# Patient Record
Sex: Female | Born: 1970 | Race: White | Smoking: Current every day smoker
Health system: Southern US, Community
[De-identification: ages and names within clinical notes are randomized; demographics above are authoritative.]

## PROBLEM LIST (undated history)

## (undated) DIAGNOSIS — M069 Rheumatoid arthritis, unspecified: Secondary | ICD-10-CM

## (undated) DIAGNOSIS — M459 Ankylosing spondylitis of unspecified sites in spine: Secondary | ICD-10-CM

## (undated) DIAGNOSIS — I499 Cardiac arrhythmia, unspecified: Secondary | ICD-10-CM

## (undated) DIAGNOSIS — G43909 Migraine, unspecified, not intractable, without status migrainosus: Secondary | ICD-10-CM

## (undated) DIAGNOSIS — J302 Other seasonal allergic rhinitis: Secondary | ICD-10-CM

## (undated) DIAGNOSIS — M351 Other overlap syndromes: Secondary | ICD-10-CM

## (undated) DIAGNOSIS — D649 Anemia, unspecified: Secondary | ICD-10-CM

## (undated) DIAGNOSIS — F419 Anxiety disorder, unspecified: Secondary | ICD-10-CM

## (undated) DIAGNOSIS — R630 Anorexia: Secondary | ICD-10-CM

## (undated) HISTORY — DX: Rheumatoid arthritis, unspecified: M06.9

## (undated) HISTORY — DX: Anemia, unspecified: D64.9

## (undated) HISTORY — DX: Migraine, unspecified, not intractable, without status migrainosus: G43.909

## (undated) HISTORY — DX: Cardiac arrhythmia, unspecified: I49.9

## (undated) HISTORY — DX: Ankylosing spondylitis of unspecified sites in spine: M45.9

## (undated) HISTORY — DX: Other overlap syndromes: M35.1

## (undated) HISTORY — DX: Other seasonal allergic rhinitis: J30.2

## (undated) HISTORY — DX: Anorexia: R63.0

## (undated) HISTORY — DX: Anxiety disorder, unspecified: F41.9

---
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
## 1995-12-17 HISTORY — PX: ABDOMINAL HYSTERECTOMY: SHX81

## 1996-12-16 HISTORY — PX: BREAST ENHANCEMENT SURGERY: SHX7

## 1999-12-17 DIAGNOSIS — F419 Anxiety disorder, unspecified: Secondary | ICD-10-CM

## 1999-12-17 HISTORY — DX: Anxiety disorder, unspecified: F41.9

## 2001-12-16 DIAGNOSIS — I499 Cardiac arrhythmia, unspecified: Secondary | ICD-10-CM

## 2001-12-16 HISTORY — PX: BACK SURGERY: SHX140

## 2001-12-16 HISTORY — DX: Cardiac arrhythmia, unspecified: I49.9

## 2002-12-16 HISTORY — PX: BACK SURGERY: SHX140

## 2004-12-16 DIAGNOSIS — J302 Other seasonal allergic rhinitis: Secondary | ICD-10-CM

## 2004-12-16 HISTORY — DX: Other seasonal allergic rhinitis: J30.2

## 2004-12-16 HISTORY — PX: INFUSION PUMP IMPLANTATION: SHX1824

## 2015-12-17 DIAGNOSIS — M069 Rheumatoid arthritis, unspecified: Secondary | ICD-10-CM

## 2015-12-17 DIAGNOSIS — M351 Other overlap syndromes: Secondary | ICD-10-CM

## 2015-12-17 HISTORY — DX: Other overlap syndromes: M35.1

## 2015-12-17 HISTORY — DX: Rheumatoid arthritis, unspecified: M06.9

## 2016-12-18 DIAGNOSIS — Z5181 Encounter for therapeutic drug level monitoring: Secondary | ICD-10-CM | POA: Diagnosis not present

## 2016-12-18 DIAGNOSIS — M797 Fibromyalgia: Secondary | ICD-10-CM | POA: Diagnosis not present

## 2016-12-18 DIAGNOSIS — M351 Other overlap syndromes: Secondary | ICD-10-CM | POA: Diagnosis not present

## 2016-12-18 DIAGNOSIS — M545 Low back pain: Secondary | ICD-10-CM | POA: Diagnosis not present

## 2016-12-18 DIAGNOSIS — M961 Postlaminectomy syndrome, not elsewhere classified: Secondary | ICD-10-CM | POA: Diagnosis not present

## 2016-12-18 DIAGNOSIS — M5116 Intervertebral disc disorders with radiculopathy, lumbar region: Secondary | ICD-10-CM | POA: Diagnosis not present

## 2016-12-18 DIAGNOSIS — M5136 Other intervertebral disc degeneration, lumbar region: Secondary | ICD-10-CM | POA: Diagnosis not present

## 2016-12-18 DIAGNOSIS — G894 Chronic pain syndrome: Secondary | ICD-10-CM | POA: Diagnosis not present

## 2016-12-18 DIAGNOSIS — M5416 Radiculopathy, lumbar region: Secondary | ICD-10-CM | POA: Diagnosis not present

## 2016-12-18 DIAGNOSIS — G43909 Migraine, unspecified, not intractable, without status migrainosus: Secondary | ICD-10-CM | POA: Diagnosis not present

## 2017-02-27 DIAGNOSIS — M25512 Pain in left shoulder: Secondary | ICD-10-CM | POA: Diagnosis not present

## 2017-02-27 DIAGNOSIS — M25532 Pain in left wrist: Secondary | ICD-10-CM | POA: Diagnosis not present

## 2017-02-27 DIAGNOSIS — M25561 Pain in right knee: Secondary | ICD-10-CM | POA: Diagnosis not present

## 2017-02-27 DIAGNOSIS — M25531 Pain in right wrist: Secondary | ICD-10-CM | POA: Diagnosis not present

## 2017-02-27 DIAGNOSIS — M25562 Pain in left knee: Secondary | ICD-10-CM | POA: Diagnosis not present

## 2017-02-27 DIAGNOSIS — M25511 Pain in right shoulder: Secondary | ICD-10-CM | POA: Diagnosis not present

## 2017-02-28 DIAGNOSIS — H04123 Dry eye syndrome of bilateral lacrimal glands: Secondary | ICD-10-CM | POA: Diagnosis not present

## 2017-02-28 DIAGNOSIS — H1840 Unspecified corneal degeneration: Secondary | ICD-10-CM | POA: Diagnosis not present

## 2017-02-28 DIAGNOSIS — H43813 Vitreous degeneration, bilateral: Secondary | ICD-10-CM | POA: Diagnosis not present

## 2017-02-28 DIAGNOSIS — H2513 Age-related nuclear cataract, bilateral: Secondary | ICD-10-CM | POA: Diagnosis not present

## 2017-02-28 DIAGNOSIS — H524 Presbyopia: Secondary | ICD-10-CM | POA: Diagnosis not present

## 2017-03-10 DIAGNOSIS — D899 Disorder involving the immune mechanism, unspecified: Secondary | ICD-10-CM | POA: Diagnosis not present

## 2017-03-10 DIAGNOSIS — M2550 Pain in unspecified joint: Secondary | ICD-10-CM | POA: Diagnosis not present

## 2017-04-09 DIAGNOSIS — M23205 Derangement of unspecified medial meniscus due to old tear or injury, unspecified knee: Secondary | ICD-10-CM | POA: Diagnosis not present

## 2017-11-27 DIAGNOSIS — J40 Bronchitis, not specified as acute or chronic: Secondary | ICD-10-CM | POA: Diagnosis not present

## 2018-05-09 NOTE — Progress Notes (Signed)
Subjective:    Patient ID: Alexandria Osborn, female    DOB: [birth year], 47 y.o.   MRN: [id number]   CC: New Patient  HPI: PMHx: Past Medical History:  Diagnosis Date  . Anemia 1989  . Anorexia 1989  . Anxiety 2001  . Irregular heart beat 2003  . Migraine 1979  . Mixed connective tissue disease (HCC) 2017  . Rheumatoid arthritis (HCC) 2017  . Seasonal allergies 2006     Surgical Hx: Past Surgical History:  Procedure Laterality Date  . BREAST ENHANCEMENT SURGERY Bilateral 1998  . HYSTEROTOMY Bilateral 1997   total  . INFUSION PUMP IMPLANTATION  2006     Family Hx: Family History  Problem Relation Age of Onset  . Heart disease Mother   . Hyperlipidemia Mother   . Hypertension Mother   . Osteoporosis Mother   . Liver cancer Father   . Anxiety disorder Father   . Diabetes Father   . Hypertension Father   . Stroke Father   . Alcohol abuse Father   . Alcohol abuse Brother   . Drug abuse Brother   . Rheum arthritis Brother   . Anxiety disorder Brother   . Rheum arthritis Maternal Grandmother   . Lung cancer Maternal Grandmother   . Cervical cancer Maternal Grandmother   . Hyperlipidemia Maternal Grandmother   . Hypertension Maternal Grandmother   . Osteoporosis Maternal Grandmother   . Stroke Maternal Grandmother      Social Hx: Who lives at home: mother (82 y.o.), dogs, cats 05/18/2018  Who would speak for you about health care matters: Raford Pitcher 05/18/2018  Transportation: other 05/18/2018 Interests / Fun: spend time with grandkids, read, cook (when able), walks 05/18/2018 Other: current smoker, 1/2 ppd since 1994 05/18/2018   Medications: Fentanyl patch q72hrs Oxycodone tid prn topamax 200mg   zanaflex 4 mg tid prn neurontin 800mg  tid prn Methotrexate 2.5 mg once weekly  humira 1 injection pen bi monthly  Folic acid 1 mg dail  ROS: Patient reports skin lesion on left temporal side of face, states it has been there for years.  Denies vision/ hearing  changes,anorexia, weight change, fever ,adenopathy, persistant / recurrent hoarseness, swallowing issues, chest pain, edema,persistant / recurrent cough, hemoptysis, dyspnea(rest, exertional, paroxysmal nocturnal), gastrointestinal  bleeding (melena, rectal bleeding), abdominal pain, excessive heart burn, GU symptoms(dysuria, hematuria, pyuria, voiding/incontinence  Issues) syncope, focal weakness, severe memory loss, depression, anxiety, abnormal bruising/bleeding, major joint swelling, breast masses or abnormal vaginal bleeding.     Preventative Screening Colonoscopy: year 2016  Mammogram: year 2016  DEXA: year 2016   Heart stress test: year 2018  Xrays: year 2018  CT/MRI: year 2018    Objective:  BP 110/70   Pulse 91   Temp 98.5 F (36.9 C) (Oral)   Ht 5' 4.85" (1.647 m)   Wt 110 lb (49.9 kg)   SpO2 98%   BMI 18.39 kg/m  Vitals and nursing note reviewed  General: well nourished, in no acute distress HEENT: normocephalic, TM's visualized bilaterally, no scleral icterus or conjunctival pallor, no nasal discharge, moist mucous membranes, good dentition without erythema or discharge noted in posterior oropharynx Neck: supple, non-tender, without lymphadenopathy Cardiac: RRR, clear S1 and S2, no murmurs, rubs, or gallops Respiratory: clear to auscultation bilaterally, no increased work of breathing Abdomen: soft, nontender, nondistended, no masses or organomegaly. Bowel sounds present Extremities: no edema or cyanosis. Warm, well perfused. 2+ radial and PT pulses bilaterally Skin: warm and dry, no rashes noted, area of hyperpigmentation  on left temporal, flat, no scaling Neuro: alert and oriented, no focal deficits   Assessment & Plan:    Healthcare maintenance Patient given/signed release of records information. Patient has extensive PMHx and is seeing multiple specialists when living in [place]. Have asked for patient to release records from PCP and from  specialists. Patient agreeable and has signed papers.   Rheumatoid arthritis (HCC) Have referred patient to rheumatology. Patient had previously been seeing rheum in [place] but was unable to establish care since moving to [place] without a PCP referral.   Mixed connective tissue disease (HCC) Have referred to both rheum and ophthalmology. Patient had previously been seeing rheum and ophthalmology in [place] but was unable to establish care since moving to [place] without a PCP referral.   Chronic pain syndrome Have referred patient to pain management. Patient had previously been seeing pain management in [place], [place] but was unable to establish care since moving to [place] without a PCP referral.   Skin change Unclear etiology at this time. Given length of time without any changes to size/pigmentation am less concerned for malignancy. Have informed patient to follow up in dermatology clinic.     Return for Derm clinic.   Oralia Manis, DO, PGY-1

## 2018-05-15 ENCOUNTER — Other Ambulatory Visit: Payer: Self-pay

## 2018-05-15 ENCOUNTER — Encounter: Payer: Self-pay | Admitting: Family Medicine

## 2018-05-15 ENCOUNTER — Ambulatory Visit: Payer: Medicaid Other | Admitting: Family Medicine

## 2018-05-15 VITALS — BP 110/70 | HR 91 | Temp 98.5°F | Ht 64.85 in | Wt 110.0 lb

## 2018-05-15 DIAGNOSIS — Z0100 Encounter for examination of eyes and vision without abnormal findings: Secondary | ICD-10-CM

## 2018-05-15 DIAGNOSIS — R239 Unspecified skin changes: Secondary | ICD-10-CM | POA: Diagnosis not present

## 2018-05-15 DIAGNOSIS — G894 Chronic pain syndrome: Secondary | ICD-10-CM | POA: Diagnosis not present

## 2018-05-15 DIAGNOSIS — M351 Other overlap syndromes: Secondary | ICD-10-CM

## 2018-05-15 DIAGNOSIS — J302 Other seasonal allergic rhinitis: Secondary | ICD-10-CM

## 2018-05-15 DIAGNOSIS — Z0000 Encounter for general adult medical examination without abnormal findings: Secondary | ICD-10-CM

## 2018-05-15 DIAGNOSIS — M069 Rheumatoid arthritis, unspecified: Secondary | ICD-10-CM | POA: Diagnosis not present

## 2018-05-15 NOTE — Patient Instructions (Signed)
It was a pleasure seeing you today.   Today we discussed your new patient visit  For your chronic problems that you see specialists for: I have referred you to pain management, rheumatology, and ophthalmology.   Please follow up with dermatology clinic as needed or sooner if symptoms persist or worsen. Please call the clinic immediately if you have any concerns.   Our clinic's number is [phone]. Please call with questions or concerns.   Thank you,  Oralia Manis, DO

## 2018-05-17 ENCOUNTER — Encounter: Payer: Self-pay | Admitting: Family Medicine

## 2018-05-17 DIAGNOSIS — G894 Chronic pain syndrome: Secondary | ICD-10-CM | POA: Insufficient documentation

## 2018-05-17 DIAGNOSIS — R239 Unspecified skin changes: Secondary | ICD-10-CM | POA: Insufficient documentation

## 2018-05-17 DIAGNOSIS — Z0000 Encounter for general adult medical examination without abnormal findings: Secondary | ICD-10-CM | POA: Insufficient documentation

## 2018-05-17 NOTE — Assessment & Plan Note (Signed)
Have referred to both rheum and ophthalmology. Patient had previously been seeing rheum and ophthalmology in [place]  but was unable to establish care since moving to [place] without a PCP referral.

## 2018-05-17 NOTE — Assessment & Plan Note (Signed)
Unclear etiology at this time. Given length of time without any changes to size/pigmentation am less concerned for malignancy. Have informed patient to follow up in dermatology clinic.

## 2018-05-17 NOTE — Assessment & Plan Note (Signed)
Have referred patient to rheumatology. Patient had previously been seeing rheum in [place] but was unable to establish care since moving to [place] without a PCP referral.

## 2018-05-17 NOTE — Assessment & Plan Note (Signed)
Patient given/signed release of records information. Patient has extensive PMHx and is seeing multiple specialists when living in [place]. Have asked for patient to release records from PCP and from specialists. Patient agreeable and has signed papers.

## 2018-05-17 NOTE — Assessment & Plan Note (Signed)
Have referred patient to pain management. Patient had previously been seeing pain management in [place], [place] but was unable to establish care since moving to [place] without a PCP referral.

## 2018-05-22 ENCOUNTER — Encounter: Payer: Self-pay | Admitting: Family Medicine

## 2018-05-22 DIAGNOSIS — M459 Ankylosing spondylitis of unspecified sites in spine: Secondary | ICD-10-CM | POA: Insufficient documentation

## 2018-06-03 DIAGNOSIS — H04123 Dry eye syndrome of bilateral lacrimal glands: Secondary | ICD-10-CM | POA: Diagnosis not present

## 2018-06-12 ENCOUNTER — Encounter: Payer: Self-pay | Admitting: Family Medicine

## 2018-06-24 ENCOUNTER — Ambulatory Visit: Payer: Medicaid Other | Admitting: Family Medicine

## 2018-06-30 DIAGNOSIS — Z79899 Other long term (current) drug therapy: Secondary | ICD-10-CM | POA: Diagnosis not present

## 2018-06-30 DIAGNOSIS — M069 Rheumatoid arthritis, unspecified: Secondary | ICD-10-CM | POA: Diagnosis not present

## 2018-07-01 ENCOUNTER — Other Ambulatory Visit: Payer: Self-pay

## 2018-07-01 ENCOUNTER — Encounter: Payer: Self-pay | Admitting: Family Medicine

## 2018-07-01 ENCOUNTER — Ambulatory Visit: Payer: Medicaid Other | Admitting: Family Medicine

## 2018-07-01 VITALS — BP 120/62 | HR 75 | Temp 98.4°F | Ht 64.0 in | Wt 112.2 lb

## 2018-07-01 DIAGNOSIS — Z95828 Presence of other vascular implants and grafts: Secondary | ICD-10-CM | POA: Diagnosis present

## 2018-07-01 DIAGNOSIS — Q249 Congenital malformation of heart, unspecified: Secondary | ICD-10-CM | POA: Diagnosis not present

## 2018-07-01 NOTE — Assessment & Plan Note (Addendum)
Unclear what abnormality is at this time as patient does not remember the diagnosis but says it was a "hole in her heart".  Possible VSD but still unclear.  Will need to obtain records of echocardiogram.  Given that patient was informed that she would need prompt cardiology referral, will refer to cardiology at this time.  Informed patient that she should have records sent to both [facility] and cardiologist so that we may both see her echocardiogram results

## 2018-07-01 NOTE — Patient Instructions (Signed)
It was a pleasure seeing you today.   Today we discussed your referrals  For your infusion pump: I have referred you to neurosurgery  For your heart: I have referred you to cardiology   Please follow up as needed or sooner if symptoms persist or worsen. Please call the clinic immediately if you have any concerns.   Our clinic's number is [phone]. Please call with questions or concerns.    Thank you,  Oralia Manis, DO

## 2018-07-01 NOTE — Progress Notes (Signed)
   Subjective:    Patient ID: Alexandria Osborn, female    DOB: May 27, 1971, 47 y.o.   MRN: [id number]   CC: Needing referrals to neurosurgery and cardiology  HPI: Referral request for neurosurgery Patient presenting today to request referral to neurosurgery.  Patient has seen neurosurgery in the past in [place], [place] but would like a second opinion here in [place].  Patient has a history of an infusion pump placed for pain management.  Patient says that pump is now malfunctioning and has been in pain for the past 4 weeks.  Says that the pain is directly in the area of the pump and she would like it removed.  Patient says that a neurosurgeon in [place] told her to have it removed back in 2012 but she had insurance difficulties at that time.  She then moved to [facility] and saw a neurosurgeon who said she did not need to remove the pump at that time.  Patient says she would like a second opinion now that the pump is bothering her.  Referral request for cardiology Patient presenting today to request referral for cardiology.  Patient says when she lived in [place], [place] that she was informed that she would need cardiology referral.  Patient then moved to [facility] which is why she needs a referral here.  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  Patient has extensive past medical history most of which occurred in [facility].  Patient says she had an echo performed in the hospital there that showed this hole.   Objective:  BP 120/62   Pulse 75   Temp 98.4 F (36.9 C) (Oral)   Ht 5\' 4"  (1.626 m)   Wt 112 lb 3.2 oz (50.9 kg)   SpO2 99%   BMI 19.26 kg/m  Vitals and nursing note reviewed  General: well nourished, in no acute distress, wearing sunglasses if she has a migraine and is experiencing  photophobia HEENT: normocephalic, no scleral icterus or conjunctival pallor, no nasal discharge, moist mucous membranes  Cardiac: RRR, clear S1 and S2, no murmurs, rubs, or gallops Respiratory: clear to auscultation bilaterally, no increased work of breathing Abdomen: soft, nontender, nondistended, no masses or organomegaly. Bowel sounds present Extremities: no edema or cyanosis. Warm, well perfused. 2+ radial and PT pulses bilaterally Skin: warm and dry, no rashes noted Neuro: alert and oriented, no focal deficits   Assessment & Plan:    Presence of implanted infusion pump Referral to neurosurgery placed  Cardiac abnormality Unclear what abnormality is at this time as patient does not remember the diagnosis but says it was a "hole in her heart".  Possible VSD but still unclear.  Will need to obtain records of echocardiogram.  Given that patient was informed that she would need prompt cardiology referral, will refer to cardiology at this time.  Informed patient that she should have records sent to both [facility] and cardiologist so that we may both see her echocardiogram results    Return if symptoms worsen or fail to improve.   [facility], DO, PGY-2

## 2018-07-01 NOTE — Assessment & Plan Note (Signed)
Referral to neurosurgery placed

## 2018-07-06 DIAGNOSIS — Z79899 Other long term (current) drug therapy: Secondary | ICD-10-CM | POA: Diagnosis not present

## 2018-07-09 DIAGNOSIS — M129 Arthropathy, unspecified: Secondary | ICD-10-CM | POA: Diagnosis not present

## 2018-07-09 DIAGNOSIS — E7800 Pure hypercholesterolemia, unspecified: Secondary | ICD-10-CM | POA: Diagnosis not present

## 2018-07-09 DIAGNOSIS — R5383 Other fatigue: Secondary | ICD-10-CM | POA: Diagnosis not present

## 2018-07-09 DIAGNOSIS — E559 Vitamin D deficiency, unspecified: Secondary | ICD-10-CM | POA: Diagnosis not present

## 2018-07-09 DIAGNOSIS — M459 Ankylosing spondylitis of unspecified sites in spine: Secondary | ICD-10-CM | POA: Diagnosis not present

## 2018-07-09 DIAGNOSIS — M810 Age-related osteoporosis without current pathological fracture: Secondary | ICD-10-CM | POA: Diagnosis not present

## 2018-07-09 DIAGNOSIS — M069 Rheumatoid arthritis, unspecified: Secondary | ICD-10-CM | POA: Diagnosis not present

## 2018-07-09 DIAGNOSIS — G8929 Other chronic pain: Secondary | ICD-10-CM | POA: Diagnosis not present

## 2018-07-09 DIAGNOSIS — Z79899 Other long term (current) drug therapy: Secondary | ICD-10-CM | POA: Diagnosis not present

## 2018-07-09 DIAGNOSIS — M542 Cervicalgia: Secondary | ICD-10-CM | POA: Diagnosis not present

## 2018-07-15 DIAGNOSIS — G8929 Other chronic pain: Secondary | ICD-10-CM | POA: Diagnosis not present

## 2018-07-15 DIAGNOSIS — M459 Ankylosing spondylitis of unspecified sites in spine: Secondary | ICD-10-CM | POA: Diagnosis not present

## 2018-07-15 DIAGNOSIS — Z79899 Other long term (current) drug therapy: Secondary | ICD-10-CM | POA: Diagnosis not present

## 2018-07-15 DIAGNOSIS — M542 Cervicalgia: Secondary | ICD-10-CM | POA: Diagnosis not present

## 2018-07-15 DIAGNOSIS — M069 Rheumatoid arthritis, unspecified: Secondary | ICD-10-CM | POA: Diagnosis not present

## 2018-07-29 DIAGNOSIS — Z79899 Other long term (current) drug therapy: Secondary | ICD-10-CM | POA: Diagnosis not present

## 2018-08-12 DIAGNOSIS — G894 Chronic pain syndrome: Secondary | ICD-10-CM | POA: Diagnosis not present

## 2018-08-12 DIAGNOSIS — M459 Ankylosing spondylitis of unspecified sites in spine: Secondary | ICD-10-CM | POA: Diagnosis not present

## 2018-08-12 DIAGNOSIS — M069 Rheumatoid arthritis, unspecified: Secondary | ICD-10-CM | POA: Diagnosis not present

## 2018-08-12 DIAGNOSIS — M62838 Other muscle spasm: Secondary | ICD-10-CM | POA: Diagnosis not present

## 2018-08-26 DIAGNOSIS — M62838 Other muscle spasm: Secondary | ICD-10-CM | POA: Diagnosis not present

## 2018-08-26 DIAGNOSIS — M545 Low back pain: Secondary | ICD-10-CM | POA: Diagnosis not present

## 2018-08-26 DIAGNOSIS — M459 Ankylosing spondylitis of unspecified sites in spine: Secondary | ICD-10-CM | POA: Diagnosis not present

## 2018-08-26 DIAGNOSIS — Z79899 Other long term (current) drug therapy: Secondary | ICD-10-CM | POA: Diagnosis not present

## 2018-08-26 DIAGNOSIS — M5136 Other intervertebral disc degeneration, lumbar region: Secondary | ICD-10-CM | POA: Diagnosis not present

## 2018-08-28 DIAGNOSIS — M0609 Rheumatoid arthritis without rheumatoid factor, multiple sites: Secondary | ICD-10-CM | POA: Diagnosis not present

## 2018-08-28 DIAGNOSIS — G894 Chronic pain syndrome: Secondary | ICD-10-CM | POA: Diagnosis not present

## 2018-09-03 ENCOUNTER — Ambulatory Visit: Payer: Medicaid Other | Admitting: Cardiology

## 2018-09-09 DIAGNOSIS — M62838 Other muscle spasm: Secondary | ICD-10-CM | POA: Diagnosis not present

## 2018-09-09 DIAGNOSIS — Z23 Encounter for immunization: Secondary | ICD-10-CM | POA: Diagnosis not present

## 2018-09-09 DIAGNOSIS — M5416 Radiculopathy, lumbar region: Secondary | ICD-10-CM | POA: Diagnosis not present

## 2018-09-09 DIAGNOSIS — M546 Pain in thoracic spine: Secondary | ICD-10-CM | POA: Diagnosis not present

## 2018-09-09 DIAGNOSIS — M545 Low back pain: Secondary | ICD-10-CM | POA: Diagnosis not present

## 2018-09-24 DIAGNOSIS — M459 Ankylosing spondylitis of unspecified sites in spine: Secondary | ICD-10-CM | POA: Diagnosis not present

## 2018-09-24 DIAGNOSIS — Z79899 Other long term (current) drug therapy: Secondary | ICD-10-CM | POA: Diagnosis not present

## 2018-09-24 DIAGNOSIS — M069 Rheumatoid arthritis, unspecified: Secondary | ICD-10-CM | POA: Diagnosis not present

## 2018-09-24 DIAGNOSIS — M546 Pain in thoracic spine: Secondary | ICD-10-CM | POA: Diagnosis not present

## 2018-09-24 DIAGNOSIS — M545 Low back pain: Secondary | ICD-10-CM | POA: Diagnosis not present

## 2018-10-05 ENCOUNTER — Ambulatory Visit: Payer: Medicaid Other

## 2018-10-06 ENCOUNTER — Ambulatory Visit: Payer: Medicaid Other | Admitting: Cardiology

## 2018-10-16 ENCOUNTER — Ambulatory Visit (INDEPENDENT_AMBULATORY_CARE_PROVIDER_SITE_OTHER): Payer: Medicaid Other | Admitting: Family Medicine

## 2018-10-16 ENCOUNTER — Encounter: Payer: Self-pay | Admitting: Family Medicine

## 2018-10-16 ENCOUNTER — Other Ambulatory Visit: Payer: Self-pay

## 2018-10-16 VITALS — BP 140/80 | HR 91 | Temp 98.7°F | Ht 64.0 in | Wt 113.2 lb

## 2018-10-16 DIAGNOSIS — M069 Rheumatoid arthritis, unspecified: Secondary | ICD-10-CM | POA: Diagnosis not present

## 2018-10-16 DIAGNOSIS — G894 Chronic pain syndrome: Secondary | ICD-10-CM

## 2018-10-16 NOTE — Patient Instructions (Signed)
It was a pleasure seeing you today.   Today we discussed your referrals  For your referrals: I have sent in referrals for pain management and rheumatology.   The neurosurgeon you were previously referred to was  [facility]  Phone: [phone] Address: [address] [place], [place], [place] [address]  Please follow up as needed or sooner if symptoms persist or worsen. Please call the clinic immediately if you have any concerns.   Our clinic's number is [phone]. Please call with questions or concerns.   Thank you,  Oralia Manis, DO

## 2018-10-16 NOTE — Assessment & Plan Note (Signed)
I have sent in referral for second opinion for pain management.  Patient was previously being seen in [facility] and had great success there.  States that she is not happy with care at [facility] in [place].  Have discussed this with Annice Pih, referral coordinator at [facility].

## 2018-10-16 NOTE — Assessment & Plan Note (Signed)
Have sent in referral for second opinion for rheumatology.

## 2018-10-16 NOTE — Progress Notes (Signed)
   Subjective:    Patient ID: Alexandria Osborn, female    DOB: [birth year], 47 y.o.   MRN: [id number]   CC: Referrals  HPI: Pain Presenting today requesting that she have a referral sent for chronic pain.  States that she has seen pain management at [place] at [facility], however, she would like to switch providers.  States that her and physician at [facility] are "not meshing".  States that [employer] is also further away for her and would like someone in [place] if possible.  States that the provider at [facility] does not appear to be listening to her and only spends 5 minutes in the room.  Dates that she is dissatisfied and would like another provider.    Rheumatology  Patient presenting today requesting that she have a referral sent for rheumatology.  Has previously been seen by rheumatology but would like another provider.  Has similar complaints of chronic pain, that divider is not listening to her and only spent 5 minutes in the room.  Patient is also upset because she states that she has requested multiple times that she be seen by a physician and has been told that she may only be seen by a physician assistant.  States that she would like to go to a rheumatologist where she can see a physician rather than an extender.   Objective:  BP 140/80   Pulse 91   Temp 98.7 F (37.1 C) (Oral)   Ht 5\' 4"  (1.626 m)   Wt 113 lb 3.2 oz (51.3 kg)   SpO2 99%   BMI 19.43 kg/m  Vitals and nursing note reviewed  General: well nourished, in no acute distress HEENT: normocephalic, moist mucous membranes, good dentition without erythema or discharge noted in posterior oropharynx Cardiac: RRR, clear S1 and S2, no murmurs, rubs, or gallops Respiratory: clear to auscultation bilaterally, no increased work of breathing Abdomen: soft, nontender, nondistended, no masses or organomegaly. Bowel sounds present Extremities: no edema or cyanosis. Warm, well perfused.  Skin: warm and  dry, no rashes noted Neuro: alert and oriented, no focal deficits   Assessment & Plan:    Chronic pain syndrome I have sent in referral for second opinion for pain management.  Patient was previously being seen in [facility] and had great success there.  States that she is not happy with care at [facility] in [place].  Have discussed this with [place], referral coordinator at [facility].   Rheumatoid arthritis (HCC) Have sent in referral for second opinion for rheumatology.      No follow-ups on file.   [facility], DO, PGY-2

## 2018-10-22 DIAGNOSIS — M546 Pain in thoracic spine: Secondary | ICD-10-CM | POA: Diagnosis not present

## 2018-10-22 DIAGNOSIS — M069 Rheumatoid arthritis, unspecified: Secondary | ICD-10-CM | POA: Diagnosis not present

## 2018-10-22 DIAGNOSIS — M459 Ankylosing spondylitis of unspecified sites in spine: Secondary | ICD-10-CM | POA: Diagnosis not present

## 2018-10-22 DIAGNOSIS — M545 Low back pain: Secondary | ICD-10-CM | POA: Diagnosis not present

## 2018-10-22 DIAGNOSIS — Z79899 Other long term (current) drug therapy: Secondary | ICD-10-CM | POA: Diagnosis not present

## 2018-10-28 ENCOUNTER — Ambulatory Visit: Payer: Medicaid Other | Admitting: Cardiology

## 2018-11-02 ENCOUNTER — Telehealth: Payer: Self-pay

## 2018-11-02 NOTE — Telephone Encounter (Signed)
Called and left message for patient to come in and sign a release of information form for records to to be sent to rheumatologist per Dr. Hurley Cisco, Sonny Masters, CMA

## 2018-11-20 DIAGNOSIS — M069 Rheumatoid arthritis, unspecified: Secondary | ICD-10-CM | POA: Diagnosis not present

## 2018-11-20 DIAGNOSIS — M5136 Other intervertebral disc degeneration, lumbar region: Secondary | ICD-10-CM | POA: Diagnosis not present

## 2018-11-20 DIAGNOSIS — Z79899 Other long term (current) drug therapy: Secondary | ICD-10-CM | POA: Diagnosis not present

## 2018-11-20 DIAGNOSIS — M545 Low back pain: Secondary | ICD-10-CM | POA: Diagnosis not present

## 2018-12-21 DIAGNOSIS — E559 Vitamin D deficiency, unspecified: Secondary | ICD-10-CM | POA: Diagnosis not present

## 2018-12-21 DIAGNOSIS — M542 Cervicalgia: Secondary | ICD-10-CM | POA: Diagnosis not present

## 2018-12-21 DIAGNOSIS — D539 Nutritional anemia, unspecified: Secondary | ICD-10-CM | POA: Diagnosis not present

## 2018-12-21 DIAGNOSIS — M545 Low back pain: Secondary | ICD-10-CM | POA: Diagnosis not present

## 2018-12-21 DIAGNOSIS — M5136 Other intervertebral disc degeneration, lumbar region: Secondary | ICD-10-CM | POA: Diagnosis not present

## 2018-12-21 DIAGNOSIS — M459 Ankylosing spondylitis of unspecified sites in spine: Secondary | ICD-10-CM | POA: Diagnosis not present

## 2018-12-21 DIAGNOSIS — M129 Arthropathy, unspecified: Secondary | ICD-10-CM | POA: Diagnosis not present

## 2018-12-21 DIAGNOSIS — Z79899 Other long term (current) drug therapy: Secondary | ICD-10-CM | POA: Diagnosis not present

## 2018-12-21 DIAGNOSIS — E7800 Pure hypercholesterolemia, unspecified: Secondary | ICD-10-CM | POA: Diagnosis not present

## 2019-01-01 DIAGNOSIS — M0609 Rheumatoid arthritis without rheumatoid factor, multiple sites: Secondary | ICD-10-CM | POA: Diagnosis not present

## 2019-01-01 DIAGNOSIS — G894 Chronic pain syndrome: Secondary | ICD-10-CM | POA: Diagnosis not present

## 2019-01-20 DIAGNOSIS — M545 Low back pain: Secondary | ICD-10-CM | POA: Diagnosis not present

## 2019-01-20 DIAGNOSIS — M069 Rheumatoid arthritis, unspecified: Secondary | ICD-10-CM | POA: Diagnosis not present

## 2019-01-20 DIAGNOSIS — M542 Cervicalgia: Secondary | ICD-10-CM | POA: Diagnosis not present

## 2019-01-20 DIAGNOSIS — Z79899 Other long term (current) drug therapy: Secondary | ICD-10-CM | POA: Diagnosis not present

## 2019-01-20 DIAGNOSIS — M459 Ankylosing spondylitis of unspecified sites in spine: Secondary | ICD-10-CM | POA: Diagnosis not present

## 2019-01-29 ENCOUNTER — Ambulatory Visit: Payer: Medicaid Other | Admitting: Family Medicine

## 2019-02-17 DIAGNOSIS — M62838 Other muscle spasm: Secondary | ICD-10-CM | POA: Diagnosis not present

## 2019-02-17 DIAGNOSIS — Z79899 Other long term (current) drug therapy: Secondary | ICD-10-CM | POA: Diagnosis not present

## 2019-02-17 DIAGNOSIS — M542 Cervicalgia: Secondary | ICD-10-CM | POA: Diagnosis not present

## 2019-02-17 DIAGNOSIS — M459 Ankylosing spondylitis of unspecified sites in spine: Secondary | ICD-10-CM | POA: Diagnosis not present

## 2019-02-17 DIAGNOSIS — G8929 Other chronic pain: Secondary | ICD-10-CM | POA: Diagnosis not present

## 2019-03-17 DIAGNOSIS — Z79891 Long term (current) use of opiate analgesic: Secondary | ICD-10-CM | POA: Diagnosis not present

## 2019-03-17 DIAGNOSIS — M62838 Other muscle spasm: Secondary | ICD-10-CM | POA: Diagnosis not present

## 2019-03-17 DIAGNOSIS — M5030 Other cervical disc degeneration, unspecified cervical region: Secondary | ICD-10-CM | POA: Diagnosis not present

## 2019-03-17 DIAGNOSIS — M545 Low back pain: Secondary | ICD-10-CM | POA: Diagnosis not present

## 2019-04-02 DIAGNOSIS — Z79899 Other long term (current) drug therapy: Secondary | ICD-10-CM | POA: Diagnosis not present

## 2019-04-02 DIAGNOSIS — M2550 Pain in unspecified joint: Secondary | ICD-10-CM | POA: Diagnosis not present

## 2019-04-02 DIAGNOSIS — M0609 Rheumatoid arthritis without rheumatoid factor, multiple sites: Secondary | ICD-10-CM | POA: Diagnosis not present

## 2019-04-12 DIAGNOSIS — M5136 Other intervertebral disc degeneration, lumbar region: Secondary | ICD-10-CM | POA: Diagnosis not present

## 2019-04-12 DIAGNOSIS — M459 Ankylosing spondylitis of unspecified sites in spine: Secondary | ICD-10-CM | POA: Diagnosis not present

## 2019-04-12 DIAGNOSIS — M545 Low back pain: Secondary | ICD-10-CM | POA: Diagnosis not present

## 2019-04-12 DIAGNOSIS — M069 Rheumatoid arthritis, unspecified: Secondary | ICD-10-CM | POA: Diagnosis not present

## 2019-05-10 DIAGNOSIS — M62838 Other muscle spasm: Secondary | ICD-10-CM | POA: Diagnosis not present

## 2019-05-10 DIAGNOSIS — M545 Low back pain: Secondary | ICD-10-CM | POA: Diagnosis not present

## 2019-05-10 DIAGNOSIS — M5136 Other intervertebral disc degeneration, lumbar region: Secondary | ICD-10-CM | POA: Diagnosis not present

## 2019-05-10 DIAGNOSIS — M459 Ankylosing spondylitis of unspecified sites in spine: Secondary | ICD-10-CM | POA: Diagnosis not present

## 2019-05-31 DIAGNOSIS — M542 Cervicalgia: Secondary | ICD-10-CM | POA: Diagnosis not present

## 2019-05-31 DIAGNOSIS — G8929 Other chronic pain: Secondary | ICD-10-CM | POA: Diagnosis not present

## 2019-05-31 DIAGNOSIS — M545 Low back pain: Secondary | ICD-10-CM | POA: Diagnosis not present

## 2019-05-31 DIAGNOSIS — M5136 Other intervertebral disc degeneration, lumbar region: Secondary | ICD-10-CM | POA: Diagnosis not present

## 2019-05-31 DIAGNOSIS — M25511 Pain in right shoulder: Secondary | ICD-10-CM | POA: Diagnosis not present

## 2019-05-31 DIAGNOSIS — M5416 Radiculopathy, lumbar region: Secondary | ICD-10-CM | POA: Diagnosis not present

## 2019-06-07 DIAGNOSIS — T7840XA Allergy, unspecified, initial encounter: Secondary | ICD-10-CM | POA: Diagnosis not present

## 2019-06-07 DIAGNOSIS — M5136 Other intervertebral disc degeneration, lumbar region: Secondary | ICD-10-CM | POA: Diagnosis not present

## 2019-06-07 DIAGNOSIS — M62838 Other muscle spasm: Secondary | ICD-10-CM | POA: Diagnosis not present

## 2019-06-07 DIAGNOSIS — M545 Low back pain: Secondary | ICD-10-CM | POA: Diagnosis not present

## 2019-06-25 ENCOUNTER — Ambulatory Visit: Payer: Medicaid Other | Admitting: Family Medicine

## 2019-07-07 DIAGNOSIS — M5440 Lumbago with sciatica, unspecified side: Secondary | ICD-10-CM | POA: Diagnosis not present

## 2019-07-07 DIAGNOSIS — Z79891 Long term (current) use of opiate analgesic: Secondary | ICD-10-CM | POA: Diagnosis not present

## 2019-07-07 DIAGNOSIS — Z79899 Other long term (current) drug therapy: Secondary | ICD-10-CM | POA: Diagnosis not present

## 2019-07-07 DIAGNOSIS — M5136 Other intervertebral disc degeneration, lumbar region: Secondary | ICD-10-CM | POA: Diagnosis not present

## 2019-07-07 DIAGNOSIS — M459 Ankylosing spondylitis of unspecified sites in spine: Secondary | ICD-10-CM | POA: Diagnosis not present

## 2019-08-04 DIAGNOSIS — Z79899 Other long term (current) drug therapy: Secondary | ICD-10-CM | POA: Diagnosis not present

## 2019-08-04 DIAGNOSIS — Z79891 Long term (current) use of opiate analgesic: Secondary | ICD-10-CM | POA: Diagnosis not present

## 2019-08-04 DIAGNOSIS — R5383 Other fatigue: Secondary | ICD-10-CM | POA: Diagnosis not present

## 2019-08-04 DIAGNOSIS — Z1159 Encounter for screening for other viral diseases: Secondary | ICD-10-CM | POA: Diagnosis not present

## 2019-08-04 DIAGNOSIS — E7800 Pure hypercholesterolemia, unspecified: Secondary | ICD-10-CM | POA: Diagnosis not present

## 2019-08-04 DIAGNOSIS — D539 Nutritional anemia, unspecified: Secondary | ICD-10-CM | POA: Diagnosis not present

## 2019-08-04 DIAGNOSIS — M129 Arthropathy, unspecified: Secondary | ICD-10-CM | POA: Diagnosis not present

## 2019-08-04 DIAGNOSIS — E559 Vitamin D deficiency, unspecified: Secondary | ICD-10-CM | POA: Diagnosis not present

## 2019-09-01 DIAGNOSIS — M545 Low back pain: Secondary | ICD-10-CM | POA: Diagnosis not present

## 2019-09-01 DIAGNOSIS — Z79891 Long term (current) use of opiate analgesic: Secondary | ICD-10-CM | POA: Diagnosis not present

## 2019-09-01 DIAGNOSIS — M5136 Other intervertebral disc degeneration, lumbar region: Secondary | ICD-10-CM | POA: Diagnosis not present

## 2019-09-01 DIAGNOSIS — Z79899 Other long term (current) drug therapy: Secondary | ICD-10-CM | POA: Diagnosis not present

## 2019-09-01 DIAGNOSIS — Z23 Encounter for immunization: Secondary | ICD-10-CM | POA: Diagnosis not present

## 2019-09-01 DIAGNOSIS — M25519 Pain in unspecified shoulder: Secondary | ICD-10-CM | POA: Diagnosis not present

## 2019-09-29 DIAGNOSIS — M5136 Other intervertebral disc degeneration, lumbar region: Secondary | ICD-10-CM | POA: Diagnosis not present

## 2019-09-29 DIAGNOSIS — M459 Ankylosing spondylitis of unspecified sites in spine: Secondary | ICD-10-CM | POA: Diagnosis not present

## 2019-09-29 DIAGNOSIS — M545 Low back pain: Secondary | ICD-10-CM | POA: Diagnosis not present

## 2019-09-29 DIAGNOSIS — M069 Rheumatoid arthritis, unspecified: Secondary | ICD-10-CM | POA: Diagnosis not present

## 2019-10-12 ENCOUNTER — Telehealth (INDEPENDENT_AMBULATORY_CARE_PROVIDER_SITE_OTHER): Payer: Medicaid Other | Admitting: Family Medicine

## 2019-10-12 ENCOUNTER — Other Ambulatory Visit: Payer: Self-pay

## 2019-10-12 DIAGNOSIS — Z95828 Presence of other vascular implants and grafts: Secondary | ICD-10-CM | POA: Diagnosis not present

## 2019-10-12 DIAGNOSIS — G894 Chronic pain syndrome: Secondary | ICD-10-CM

## 2019-10-12 NOTE — Progress Notes (Signed)
[place] Telemedicine Visit I connected with  Alexandria Osborn on 10/13/19 by a video enabled telemedicine application and verified that I am speaking with the correct person using two identifiers.   I discussed the limitations of evaluation and management by telemedicine. The patient expressed understanding and agreed to proceed.   Patient consented to have virtual visit. Method of visit: Video was attempted, but technology challenges prevented patient from using video, so visit was conducted via telephone.  Encounter participants: Patient: Alexandria Osborn - located at Home Provider: Caroline More - located at [facility] Others (if applicable): None  Chief Complaint: Needs referrals   HPI: Desire for referral  Wants a change in pain management. Wants to go to interventional pain. Goes to [facility]. Reports worsening neck pain. Has lots of autoimmune issues and h/o torn rotator cuff issues. H/o disc bulg.   Needs infusion pump removed but only certain physicians can remove that. Will drop off a list of acceptable providers provided by company that created pump. Will drop off list on 10/28.   Patient has a car so no longer has transportation issues.   ROS: per HPI  Pertinent PMHx: ankylosing spondylitis, RA, chronic pain syndrome, mixed connective tissue disease, presence of implanted infusion pump  Exam:  Respiratory: speaking full sentences, no increased WOB   Assessment/Plan:  Chronic pain syndrome Patient currently is seen at [facility].  Would like a second opinion as she would like to be seen in another chronic pain management center.  I have placed referral.  Presence of implanted infusion pump Patient has been in need of neurosurgery evaluation.  Previous referral was placed but her infusion pump manufacture performed her there are only certain physicians in the [place] area that will be able to help with her specific infusion  pump.  They have provided her with a list.  Patient will drop off a list to our clinic in the next day or 2.  Have checked my in box today (10/13/2019) and I have not seen it yet.  Once I receive this list I will place a referral for her specific neurosurgeon.    Time spent during visit with patient: 15 minutes

## 2019-10-13 NOTE — Assessment & Plan Note (Signed)
Patient has been in need of neurosurgery evaluation.  Previous referral was placed but her infusion pump manufacture performed her there are only certain physicians in the [place] area that will be able to help with her specific infusion pump.  They have provided her with a list.  Patient will drop off a list to our clinic in the next day or 2.  Have checked my in box today (10/13/2019) and I have not seen it yet.  Once I receive this list I will place a referral for her specific neurosurgeon.

## 2019-10-13 NOTE — Assessment & Plan Note (Signed)
Patient currently is seen at [facility].  Would like a second opinion as she would like to be seen in another chronic pain management center.  I have placed referral.

## 2019-10-19 ENCOUNTER — Telehealth: Payer: Self-pay | Admitting: *Deleted

## 2019-10-19 ENCOUNTER — Other Ambulatory Visit: Payer: Self-pay | Admitting: Family Medicine

## 2019-10-19 ENCOUNTER — Telehealth: Payer: Self-pay | Admitting: Family Medicine

## 2019-10-19 DIAGNOSIS — Z95828 Presence of other vascular implants and grafts: Secondary | ICD-10-CM

## 2019-10-19 NOTE — Telephone Encounter (Signed)
Pt came by in office and dropped of a list of Neuro Doctors for her referral. Copy of list placed in MD's Box. Best phone # [phone]

## 2019-10-19 NOTE — Telephone Encounter (Signed)
-----   Message from Caroline More, DO sent at 10/19/2019  8:32 AM EST ----- Please inform patient that we are currently working on her referral to pain management. This can take 4-6 weeks for insurance to approve so she should continue to be seen at [facility] until the new referral is processed by her insurance.  Thanks,  Caroline More, DO, PGY-3 [facility] 10/19/2019 8:32 AM

## 2019-10-19 NOTE — Telephone Encounter (Signed)
Pt informed. Deseree Blount, CMA  

## 2019-10-19 NOTE — Telephone Encounter (Signed)
Please inform patient I have placed referrals. Will put list of doctors in [place], [place], PGY-3 [facility] 10/19/2019 1:24 PM

## 2019-10-22 NOTE — Telephone Encounter (Signed)
Called and left voice message for patient concerning referrals.  Advised patient to allow office at referral to call them within the next few weeks.  If not she can call our office back.  [name], [place]

## 2019-11-03 DIAGNOSIS — G8929 Other chronic pain: Secondary | ICD-10-CM | POA: Diagnosis not present

## 2019-11-03 DIAGNOSIS — M549 Dorsalgia, unspecified: Secondary | ICD-10-CM | POA: Diagnosis not present

## 2019-11-03 DIAGNOSIS — M542 Cervicalgia: Secondary | ICD-10-CM | POA: Diagnosis not present

## 2019-11-03 DIAGNOSIS — Z23 Encounter for immunization: Secondary | ICD-10-CM | POA: Diagnosis not present

## 2019-11-03 DIAGNOSIS — Z79899 Other long term (current) drug therapy: Secondary | ICD-10-CM | POA: Diagnosis not present

## 2019-11-03 DIAGNOSIS — M5030 Other cervical disc degeneration, unspecified cervical region: Secondary | ICD-10-CM | POA: Diagnosis not present

## 2019-12-03 ENCOUNTER — Telehealth (INDEPENDENT_AMBULATORY_CARE_PROVIDER_SITE_OTHER): Payer: Medicaid Other | Admitting: Family Medicine

## 2019-12-03 ENCOUNTER — Other Ambulatory Visit: Payer: Self-pay

## 2019-12-03 DIAGNOSIS — E7800 Pure hypercholesterolemia, unspecified: Secondary | ICD-10-CM | POA: Diagnosis not present

## 2019-12-03 DIAGNOSIS — D539 Nutritional anemia, unspecified: Secondary | ICD-10-CM | POA: Diagnosis not present

## 2019-12-03 DIAGNOSIS — J069 Acute upper respiratory infection, unspecified: Secondary | ICD-10-CM

## 2019-12-03 DIAGNOSIS — M129 Arthropathy, unspecified: Secondary | ICD-10-CM | POA: Diagnosis not present

## 2019-12-03 DIAGNOSIS — Z79899 Other long term (current) drug therapy: Secondary | ICD-10-CM | POA: Diagnosis not present

## 2019-12-03 DIAGNOSIS — E559 Vitamin D deficiency, unspecified: Secondary | ICD-10-CM | POA: Diagnosis not present

## 2019-12-03 DIAGNOSIS — H60501 Unspecified acute noninfective otitis externa, right ear: Secondary | ICD-10-CM | POA: Diagnosis not present

## 2019-12-03 DIAGNOSIS — Z1159 Encounter for screening for other viral diseases: Secondary | ICD-10-CM | POA: Diagnosis not present

## 2019-12-03 DIAGNOSIS — R5383 Other fatigue: Secondary | ICD-10-CM | POA: Diagnosis not present

## 2019-12-03 DIAGNOSIS — R300 Dysuria: Secondary | ICD-10-CM | POA: Diagnosis not present

## 2019-12-03 MED ORDER — CIPROFLOXACIN-DEXAMETHASONE 0.3-0.1 % OT SUSP: 4.0000 [drp] | Freq: Two times a day (BID) | OTIC | 0 refills | Status: AC

## 2019-12-03 NOTE — Progress Notes (Signed)
[facility] Telemedicine Visit  Patient consented to have virtual visit. Method of visit: Telephone  Encounter participants: Patient: Alexandria Osborn - located at Home  Provider: Patriciaann Clan - located at [facility] Others (if applicable): None   Chief Complaint: Ear infection  HPI: Alexandria Osborn is a 48 year old female with a history of cardiac abnormality, rheumatoid arthritis, ankylosing spondylitis, mixed connective tissue disease, and chronic pain syndrome presenting virtually discussed the following:  Ear infection: Right ear. Started about 1 week ago. Has been noticing ear pressure which has had her concerned for an ear infection/otitis externa, has had this several times and feels like her previous infections. Denies any fever, change in taste/smell, shortness of breath. Now with runny nose, nasal congestion, post-nasal cough for the past day or so. A little bit of sinus headache.  But otherwise still has her normal amount of energy and able to do things.  Lives with her mother, doesn't go out and gets delivery service. No known COVID exposures.  Says she has mold in this house and wonders about may be contributing.  She has been using leftover polymyxin eardrops for previous ear infection intermittently for the past week, has helped some and loosened up some ear wax that allowed it to feel better/less clogged.  Her wax has become a little darker in color with odor to it. She has Sjogren's syndrome, so often will have a thickening of her earwax and buildup.    ROS: per HPI  Pertinent PMHx: history of cardiac abnormality, rheumatoid arthritis, ankylosing spondylitis, mixed connective tissue disease, and chronic pain syndrome, Sjogren's   Exam:  Respiratory: Unlabored breathing, speaking in full sentences, sounds slightly congested  Assessment/Plan:  Otitis externa Suspected through history provided, patient will frequently experience otitis externa due to Sjogren's  contributing to thickening and buildup of her wax causing blockage.  Through shared decision-making including trial of otic drops vs being seen in person for an otic evaluation, ultimately decided on trialing Ciprodex drops and see how she does, specifically chose this as well to avoid any aminoglycosides especially as I cannot see her membrane.  URI (upper respiratory infection) Additionally suspected through her symptoms.  Discussed inability to reliably rule out Covid due to her URI symptoms and recommendation to proceed with testing, however patient is adamant that she rarely leaves the house and essentially isolates already due to her numerous autoimmune disorders.  Recommended conservative care including hot showers, tea with honey, Tylenol.  As she lives with her mother, discussed wearing her mask and limiting interactions as much as possible until her symptoms are resolved.    Follow-up if symptoms are not improving or sooner if worsening.  Time spent during visit with patient: 12 minutes  Patriciaann Clan, DO

## 2019-12-04 DIAGNOSIS — J069 Acute upper respiratory infection, unspecified: Secondary | ICD-10-CM | POA: Insufficient documentation

## 2019-12-04 DIAGNOSIS — H6090 Unspecified otitis externa, unspecified ear: Secondary | ICD-10-CM | POA: Insufficient documentation

## 2019-12-04 NOTE — Assessment & Plan Note (Addendum)
Additionally suspected through her symptoms.  Discussed inability to reliably rule out Covid (although low suspicion) due to her URI symptoms and recommendation to proceed with testing, however patient is adamant that she rarely leaves the house and essentially isolates already due to her numerous autoimmune disorders.  Recommended conservative care including hot showers, tea with honey, Tylenol.  As she lives with her mother, discussed wearing her mask and limiting interactions as much as possible until her symptoms are resolved.

## 2019-12-04 NOTE — Assessment & Plan Note (Signed)
Suspected through history provided, patient will frequently experience otitis externa due to Sjogren's contributing to thickening and buildup of her wax causing blockage.  Through shared decision-making including trial of otic drops vs being seen in person for an otic evaluation, ultimately decided on trialing Ciprodex drops and see how she does, specifically chose this as well to avoid any aminoglycosides especially as I cannot see her membrane.

## 2019-12-29 ENCOUNTER — Telehealth: Payer: Self-pay | Admitting: Family Medicine

## 2019-12-29 NOTE — Telephone Encounter (Signed)
Pt is calling to check on the status of having a new referral sent to [facility]. Pt said a referral was sent in November but it was sent to the wrong place. She wants to make sure this referral is sent to [facility] asap.   The best call back number is [phone] if there are any questions for patient.

## 2019-12-30 ENCOUNTER — Encounter: Payer: Medicaid Other | Admitting: Family Medicine

## 2019-12-30 ENCOUNTER — Other Ambulatory Visit: Payer: Self-pay | Admitting: Family Medicine

## 2019-12-30 DIAGNOSIS — G894 Chronic pain syndrome: Secondary | ICD-10-CM

## 2019-12-30 NOTE — Telephone Encounter (Signed)
Per chart review it appears on November 6 the referral was sent [facility].  I am not sure why this did not go through.  I have sent in a second referral.  Orpah Clinton, PGY-3 [facility] 12/30/2019 8:16 AM

## 2020-01-05 DIAGNOSIS — Z79891 Long term (current) use of opiate analgesic: Secondary | ICD-10-CM | POA: Diagnosis not present

## 2020-01-05 DIAGNOSIS — M5440 Lumbago with sciatica, unspecified side: Secondary | ICD-10-CM | POA: Diagnosis not present

## 2020-01-05 DIAGNOSIS — M5136 Other intervertebral disc degeneration, lumbar region: Secondary | ICD-10-CM | POA: Diagnosis not present

## 2020-01-05 DIAGNOSIS — G8929 Other chronic pain: Secondary | ICD-10-CM | POA: Diagnosis not present

## 2020-01-05 DIAGNOSIS — M542 Cervicalgia: Secondary | ICD-10-CM | POA: Diagnosis not present

## 2020-01-19 ENCOUNTER — Telehealth (INDEPENDENT_AMBULATORY_CARE_PROVIDER_SITE_OTHER): Payer: Medicaid Other | Admitting: Family Medicine

## 2020-01-19 ENCOUNTER — Other Ambulatory Visit: Payer: Self-pay

## 2020-01-19 DIAGNOSIS — H9209 Otalgia, unspecified ear: Secondary | ICD-10-CM | POA: Insufficient documentation

## 2020-01-19 DIAGNOSIS — H9203 Otalgia, bilateral: Secondary | ICD-10-CM | POA: Diagnosis not present

## 2020-01-19 NOTE — Progress Notes (Signed)
[facility] Telemedicine Visit I connected with  Alexandria Osborn on 01/19/20 by a video enabled telemedicine application and verified that I am speaking with the correct person using two identifiers.   I discussed the limitations of evaluation and management by telemedicine. The patient expressed understanding and agreed to proceed.  Patient consented to have virtual visit. Method of visit: Telephone  Encounter participants: Patient: Alexandria Osborn - located at home Provider: Oralia Manis - located at [facility] Others (if applicable): none  Chief Complaint: ear pain  HPI: Ear pain Patient reports she feels like she has bilateral ear infections. Patient reports bilateral pain. Feels like "it is really clogged". Has intermittent fevers at night. Has tried ear drops but this is not working. Tried ciprodex from Dr. Annia Friendly on 12/18. Pain has worsened so she had to use drops she had from a previous visit, polymycin. These did help some. Still having lingering pain. In the past she has used gentomycin which helped. Patient has a h/o Sjogren's which causes issues with ear infections. Patient gets ear infections whenever she gets an upper respiratory infection. Most recently when she got the flu vaccine. Got the flu vaccine x2 months.  Has been having congestion since  ROS: per HPI  Pertinent PMHx: RA, mixed connective tissue disease, seasonal allergies   Exam:  Respiratory: speaking full sentences, no increased WOB  Assessment/Plan:  Ear pain Patient with bilateral ear pain and congestion x2 months.  Unlikely coronavirus related as this has been persistent for 2 months.  She has tried multiple antibacterial eardrops without any success.  History of Sjogren's puts her at high risk for fungal disease.  Given that patient has been having symptoms for 2 months she will need an in person exam.  I discussed this with Dr. Manson Passey who agrees and approved that she can come in person.  When  she comes in for in person visit please make sure to obtain a culture of any ear discharge to rule out fungal and bacterial etiology.  If fungal can use Gold dust as treatment.  Strict return precautions given.  ATC appointment scheduled for 01/20/20, this was patient preferred date and time.  Will forward message to Dr. Linwood Dibbles who is seeing patient tomorrow.    Time spent during visit with patient: 15 minutes

## 2020-01-19 NOTE — Assessment & Plan Note (Addendum)
Patient with bilateral ear pain and congestion x2 months.  Unlikely coronavirus related as this has been persistent for 2 months.  She has tried multiple antibacterial eardrops without any success.  History of Sjogren's puts her at high risk for fungal disease.  Given that patient has been having symptoms for 2 months she will need an in person exam.  I discussed this with Dr. Manson Passey who agrees and approved that she can come in person.  When she comes in for in person visit please make sure to obtain a culture of any ear discharge to rule out fungal and bacterial etiology.  If fungal can use Gold dust as treatment.  Strict return precautions given.  ATC appointment scheduled for 01/20/20, this was patient preferred date and time.  Will forward message to Dr. Linwood Dibbles who is seeing patient tomorrow.

## 2020-01-20 ENCOUNTER — Ambulatory Visit: Payer: Medicaid Other

## 2020-01-20 NOTE — Progress Notes (Deleted)
   CHIEF COMPLAINT / HPI:  Ear pain sjogren's   PERTINENT  PMH / PSH: ***   OBJECTIVE: There were no vitals taken for this visit.  ***  ASSESSMENT / PLAN:  No problem-specific Assessment & Plan notes found for this encounter.     Mirian Mo, MD [facility]

## 2020-01-21 ENCOUNTER — Ambulatory Visit: Payer: Medicaid Other

## 2020-01-24 ENCOUNTER — Ambulatory Visit: Payer: Medicaid Other

## 2020-01-25 DIAGNOSIS — M542 Cervicalgia: Secondary | ICD-10-CM | POA: Diagnosis not present

## 2020-01-25 DIAGNOSIS — M25561 Pain in right knee: Secondary | ICD-10-CM | POA: Diagnosis not present

## 2020-01-25 DIAGNOSIS — M961 Postlaminectomy syndrome, not elsewhere classified: Secondary | ICD-10-CM | POA: Diagnosis not present

## 2020-01-25 DIAGNOSIS — M545 Low back pain: Secondary | ICD-10-CM | POA: Diagnosis not present

## 2020-01-31 ENCOUNTER — Ambulatory Visit: Payer: Medicaid Other

## 2020-02-18 DIAGNOSIS — M542 Cervicalgia: Secondary | ICD-10-CM | POA: Diagnosis not present

## 2020-02-18 DIAGNOSIS — M545 Low back pain: Secondary | ICD-10-CM | POA: Diagnosis not present

## 2020-02-18 DIAGNOSIS — G890 Central pain syndrome: Secondary | ICD-10-CM | POA: Diagnosis not present

## 2020-02-18 DIAGNOSIS — M961 Postlaminectomy syndrome, not elsewhere classified: Secondary | ICD-10-CM | POA: Diagnosis not present

## 2020-03-07 DIAGNOSIS — Z03818 Encounter for observation for suspected exposure to other biological agents ruled out: Secondary | ICD-10-CM | POA: Diagnosis not present

## 2020-03-10 DIAGNOSIS — M961 Postlaminectomy syndrome, not elsewhere classified: Secondary | ICD-10-CM | POA: Diagnosis not present

## 2020-03-10 DIAGNOSIS — M545 Low back pain: Secondary | ICD-10-CM | POA: Diagnosis not present

## 2020-03-10 DIAGNOSIS — M542 Cervicalgia: Secondary | ICD-10-CM | POA: Diagnosis not present

## 2020-03-10 DIAGNOSIS — M25561 Pain in right knee: Secondary | ICD-10-CM | POA: Diagnosis not present

## 2020-04-07 DIAGNOSIS — M25552 Pain in left hip: Secondary | ICD-10-CM | POA: Diagnosis not present

## 2020-04-07 DIAGNOSIS — M25512 Pain in left shoulder: Secondary | ICD-10-CM | POA: Diagnosis not present

## 2020-04-07 DIAGNOSIS — M25511 Pain in right shoulder: Secondary | ICD-10-CM | POA: Diagnosis not present

## 2020-04-07 DIAGNOSIS — M25551 Pain in right hip: Secondary | ICD-10-CM | POA: Diagnosis not present

## 2020-04-26 DIAGNOSIS — Z23 Encounter for immunization: Secondary | ICD-10-CM | POA: Diagnosis not present

## 2020-05-02 DIAGNOSIS — M25512 Pain in left shoulder: Secondary | ICD-10-CM | POA: Diagnosis not present

## 2020-05-02 DIAGNOSIS — M25562 Pain in left knee: Secondary | ICD-10-CM | POA: Diagnosis not present

## 2020-05-02 DIAGNOSIS — M25561 Pain in right knee: Secondary | ICD-10-CM | POA: Diagnosis not present

## 2020-05-02 DIAGNOSIS — M25551 Pain in right hip: Secondary | ICD-10-CM | POA: Diagnosis not present

## 2020-05-02 DIAGNOSIS — M542 Cervicalgia: Secondary | ICD-10-CM | POA: Diagnosis not present

## 2020-05-02 DIAGNOSIS — M25552 Pain in left hip: Secondary | ICD-10-CM | POA: Diagnosis not present

## 2020-05-02 DIAGNOSIS — M25511 Pain in right shoulder: Secondary | ICD-10-CM | POA: Diagnosis not present

## 2020-05-02 DIAGNOSIS — G890 Central pain syndrome: Secondary | ICD-10-CM | POA: Diagnosis not present

## 2020-05-02 DIAGNOSIS — M961 Postlaminectomy syndrome, not elsewhere classified: Secondary | ICD-10-CM | POA: Diagnosis not present

## 2020-05-16 NOTE — Progress Notes (Deleted)
Subjective:  CC -- Annual Physical; With complaints of ***  Pt reports she ***   Cardiovascular: - Risk as of ***(date): *** (assessment every 3-5 years) - Dx Hypertension: {YES/NO/WILD VPXTG:62694}  - Dx Hyperlipidemia: {YES/NO/WILD WNIOE:70350} (once age 50-21; high risk >35yo, low risk >45)*** - Dx Obesity: {YES/NO/WILD CARDS:18581} (Class I BMI <34.9, Class II <39.9, Class III < 49.9)*** - Physical Activity: {YES/NO/WILD KXFGH:82993}  - Diabetes: {YES/NO/WILD ZJIRC:78938} (age 50-70 w/ BMI >25, or HTN, or HLD) *** (A1c >6.5, or classic Sxs w/ random CBG >200, or FBG >126 (fasting >8hr))***  Cancer: Colorectal >> Colonoscopy: {YES/NO/WILD BOFBP:10258} (Risk factors?, W/o risk factors > 50yo)*** Lung >> Tobacco Use: {YES/NO/WILD NIDPO:24235} (age 61-74 w/ >6yr/pack/yr Hx, unless quit >83yr ago) ***  - If so, previous Low-Dose CT screen: {YES/NO/WILD TIRWE:31540}  Breast >> Mammogram: {YES/NO/WILD CARDS:18581} (>40yo to be discussed; Q43yrs)*** Cervical/Endometrial >>  - Postmenopausal: {YES/NO/WILD GQQPY:19509}  - Vaginal Bleeding: {YES/NO/WILD TOIZT:24580} - Pap Smear: {YES/NO/WILD DXIPJ:82505}   - Previous Abnormal Pap: {YES/NO/WILD CARDS:18581} (21-29yo Q17yrs; >30yo Q87yr w/ neg HPV)*** Skin >> Suspicious lesions: {YES/NO/WILD LZJQB:34193}   Social: Alcohol Use: {YES/NO/WILD XTKWI:09735}  Tobacco Use: {YES/NO/WILD HGDJM:42683}   - Interested in Quitting: {YES/NO/WILD MHDQQ:22979}  Other Drugs: {YES/NO/WILD GXQJJ:94174}  Risky Sexual Behavior: {YES/NO/WILD CARDS:18581}  - Chlamydia: <25yo, or >25yo and incr risk - Gonorrhea: sexually active <25yo, or at increased risk - Syphilis: If at increased risk - HIV: All individuals (15-18yo once, then annually if risks are high) >> should be checked anytime STDs are checked. - Hep C: once if born in [place] between 1945-1965; or at increased risk - Hep B: If at increased risk*** Depression: {YES/NO/WILD CARDS:18581}   - PHQ9 score:    Support and Life at Home: {YES/NO/WILD YCXKG:81856}   Other: Osteoporosis: {YES/NO/WILD CARDS:18581} (postmenopausal women <65yo with risk factors -- do BMD screen)*** Zoster Vaccine: {YES/NO/WILD DJSHF:02637} (those >50yo)*** Flu Vaccine: {YES/NO/WILD CHYIF:02774}  Pneumonia Vaccine: {YES/NO/WILD JOINO:67672} (those w/ risk factors) - Both: Immunocompromised, cochlear implant, CSF leak, asplenic, sickle cell, CKD - PPSV-23 only: Heart dz, lung disease, DM, tobacco abuse, alcoholism, cirrhosis/liver disease.***   ROS-  Past Medical History Patient Active Problem List   Diagnosis Date Noted  . Ear pain 01/19/2020  . Otitis externa 12/04/2019  . URI (upper respiratory infection) 12/04/2019  . Presence of implanted infusion pump 07/01/2018  . Cardiac abnormality 07/01/2018  . Ankylosing spondylitis ([place])   . Healthcare maintenance 05/17/2018  . Chronic pain syndrome 05/17/2018  . Skin change 05/17/2018  . Rheumatoid arthritis ([place]) 12/17/2015  . Mixed connective tissue disease ([place]) 12/17/2015  . Seasonal allergies 12/16/2004  . Anemia 12/17/1987    Medications- reviewed and updated Current Outpatient Medications  Medication Sig Dispense Refill  . Adalimumab (HUMIRA) 40 MG/0.4ML PSKT Inject 1 pen into the skin every 14 (fourteen) days.    Marland Kitchen albuterol (PROVENTIL HFA;VENTOLIN HFA) 108 (90 Base) MCG/ACT inhaler Inhale 2 puffs into the lungs every 4 (four) hours as needed for wheezing or shortness of breath.    . budesonide-formoterol (SYMBICORT) 80-4.5 MCG/ACT inhaler Inhale 2 puffs into the lungs every 4 (four) hours as needed.    . cycloSPORINE (RESTASIS) 0.05 % ophthalmic emulsion Place 1 drop into both eyes 2 (two) times daily.    Marland Kitchen doxycycline (VIBRAMYCIN) 100 MG capsule Take 100 mg by mouth 2 (two) times daily.    . fentaNYL (DURAGESIC - DOSED MCG/HR) 100 MCG/HR Place 100 mcg onto the skin every 3 (three) days.    Marland Kitchen  folic acid (FOLVITE) 1 MG tablet Take 1 mg by mouth daily.     Marland Kitchen gabapentin (NEURONTIN) 800 MG tablet Take 800 mg by mouth 3 (three) times daily as needed.    . methotrexate (RHEUMATREX) 2.5 MG tablet Take 2.5 mg by mouth once a week. Caution:Chemotherapy. Protect from light.    . naloxone (NARCAN) nasal spray 4 mg/0.1 mL Place 1 spray into the nose once as needed.    Marland Kitchen oxyCODONE (ROXICODONE) 15 MG immediate release tablet Take 15 mg by mouth 3 (three) times daily as needed for pain.     Marland Kitchen tiZANidine (ZANAFLEX) 2 MG tablet Take 4 mg by mouth 3 (three) times daily as needed for muscle spasms.    Marland Kitchen topiramate (TOPAMAX) 200 MG tablet Take 200 mg by mouth 2 (two) times daily.     No current facility-administered medications for this visit.    Objective: There were no vitals taken for this visit. Gen: NAD, alert, cooperative with exam*** HEENT: NCAT, EOMI, PERRL CV: RRR, good S1/S2, no murmur Resp: CTABL, no wheezes, non-labored Abd: Soft, Non Tender, Non Distended, BS present, no guarding or organomegaly Genital Exam: {genitourinary exam:311642::"not done"} Ext: No edema, warm Neuro: Alert and oriented, No gross deficits   Assessment/Plan:  No problem-specific Assessment & Plan notes found for this encounter.   No orders of the defined types were placed in this encounter.   No orders of the defined types were placed in this encounter.    Oralia Manis, DO, PGY-3 05/16/2020 11:30 AM

## 2020-05-17 ENCOUNTER — Encounter: Payer: Medicaid Other | Admitting: Family Medicine

## 2020-05-18 DIAGNOSIS — Z23 Encounter for immunization: Secondary | ICD-10-CM | POA: Diagnosis not present

## 2020-06-13 DIAGNOSIS — M25552 Pain in left hip: Secondary | ICD-10-CM | POA: Diagnosis not present

## 2020-06-13 DIAGNOSIS — M25511 Pain in right shoulder: Secondary | ICD-10-CM | POA: Diagnosis not present

## 2020-06-13 DIAGNOSIS — M25512 Pain in left shoulder: Secondary | ICD-10-CM | POA: Diagnosis not present

## 2020-06-13 DIAGNOSIS — M25551 Pain in right hip: Secondary | ICD-10-CM | POA: Diagnosis not present

## 2020-07-06 DIAGNOSIS — M545 Low back pain: Secondary | ICD-10-CM | POA: Diagnosis not present

## 2020-07-06 DIAGNOSIS — G890 Central pain syndrome: Secondary | ICD-10-CM | POA: Diagnosis not present

## 2020-07-06 DIAGNOSIS — M25511 Pain in right shoulder: Secondary | ICD-10-CM | POA: Diagnosis not present

## 2020-07-06 DIAGNOSIS — M25512 Pain in left shoulder: Secondary | ICD-10-CM | POA: Diagnosis not present

## 2020-07-06 DIAGNOSIS — M25562 Pain in left knee: Secondary | ICD-10-CM | POA: Diagnosis not present

## 2020-07-06 DIAGNOSIS — M25552 Pain in left hip: Secondary | ICD-10-CM | POA: Diagnosis not present

## 2020-07-06 DIAGNOSIS — M542 Cervicalgia: Secondary | ICD-10-CM | POA: Diagnosis not present

## 2020-07-06 DIAGNOSIS — M961 Postlaminectomy syndrome, not elsewhere classified: Secondary | ICD-10-CM | POA: Diagnosis not present

## 2020-07-06 DIAGNOSIS — M25561 Pain in right knee: Secondary | ICD-10-CM | POA: Diagnosis not present

## 2020-07-06 DIAGNOSIS — M25551 Pain in right hip: Secondary | ICD-10-CM | POA: Diagnosis not present

## 2020-07-19 ENCOUNTER — Other Ambulatory Visit: Payer: Self-pay

## 2020-07-19 ENCOUNTER — Ambulatory Visit (INDEPENDENT_AMBULATORY_CARE_PROVIDER_SITE_OTHER): Payer: Medicaid Other | Admitting: Student in an Organized Health Care Education/Training Program

## 2020-07-19 VITALS — BP 158/80 | HR 96 | Wt 140.6 lb

## 2020-07-19 DIAGNOSIS — R030 Elevated blood-pressure reading, without diagnosis of hypertension: Secondary | ICD-10-CM

## 2020-07-19 DIAGNOSIS — Z716 Tobacco abuse counseling: Secondary | ICD-10-CM | POA: Diagnosis not present

## 2020-07-19 DIAGNOSIS — Z0000 Encounter for general adult medical examination without abnormal findings: Secondary | ICD-10-CM | POA: Diagnosis not present

## 2020-07-19 DIAGNOSIS — G894 Chronic pain syndrome: Secondary | ICD-10-CM | POA: Diagnosis present

## 2020-07-19 DIAGNOSIS — Z1159 Encounter for screening for other viral diseases: Secondary | ICD-10-CM | POA: Diagnosis not present

## 2020-07-19 DIAGNOSIS — Z23 Encounter for immunization: Secondary | ICD-10-CM

## 2020-07-19 DIAGNOSIS — Z1329 Encounter for screening for other suspected endocrine disorder: Secondary | ICD-10-CM | POA: Diagnosis not present

## 2020-07-19 DIAGNOSIS — Z5181 Encounter for therapeutic drug level monitoring: Secondary | ICD-10-CM | POA: Diagnosis not present

## 2020-07-19 DIAGNOSIS — Z13220 Encounter for screening for lipoid disorders: Secondary | ICD-10-CM | POA: Diagnosis not present

## 2020-07-19 DIAGNOSIS — Z720 Tobacco use: Secondary | ICD-10-CM

## 2020-07-19 DIAGNOSIS — Z114 Encounter for screening for human immunodeficiency virus [HIV]: Secondary | ICD-10-CM | POA: Diagnosis not present

## 2020-07-19 MED ORDER — BUPROPION HCL ER (XL) 150 MG PO TB24: 150.0000 mg | ORAL_TABLET | Freq: Every day | ORAL | 1 refills | Status: AC

## 2020-07-19 MED ORDER — CYCLOBENZAPRINE HCL 10 MG PO TABS: 10.0000 mg | ORAL_TABLET | Freq: Three times a day (TID) | ORAL | 0 refills | Status: AC | PRN

## 2020-07-19 NOTE — Progress Notes (Signed)
° ° °  SUBJECTIVE:   CHIEF COMPLAINT / HPI: Medication management and health maintenance  Medication management-reviewed patient's medication list and discontinued the medication she is no longer taking.  Chronic pain syndrome-patient not satisfied with current pain management clinic so is requesting referral to a different clinic.  Her pain is currently poorly controlled on oxycodone, Flexeril, gabapentin.  Her Topamax prescription has run out which she takes for migraines.  Tobacco use-patient smokes approximately a pack per day and wants to quit in the future but is not ready at this time.  She is open to using Wellbutrin.  She cannot use Chantix as it gives her chest pain.  Health maintenance-patient is behind on her screening for bone density, breast cancer and cervical cancer screenings.  Also due for hep C and HIV screening.  OBJECTIVE:   BP (!) 158/80    Pulse 96    Wt 140 lb 9.6 oz (63.8 kg)    SpO2 98%    BMI 24.13 kg/m   General: NAD, pleasant, able to participate in exam Cardiac: RRR, normal heart sounds, no murmurs. 2+ radial and PT pulses bilaterally Respiratory: CTAB, normal effort, No wheezes, rales or rhonchi Abdomen: soft, nontender, nondistended, no hepatic or splenomegaly, +BS Extremities: no edema or cyanosis. WWP. Skin: warm and dry, no rashes noted Neuro: alert and oriented x4, no focal deficits Psych: Normal affect and mood  ASSESSMENT/PLAN:   Elevated blood pressure reading 158/80.  Not currently being treated.  Recheck at next visit and consider starting therapy if still elevated  Chronic pain syndrome Referred to new pain management clinic for further therapy.  Refilled Flexeril. Patient is open to water PT. -Referral to [facility]  Tobacco abuse counseling Patient is current smoker 1ppd and would like to try Wellbutrin.  Failed Chantix in the past.  Healthcare maintenance Multiple screenings today ordered including: Mammogram, DEXA (risk factors  include smoking and H/oh steroid use), hep C, HIV, TSH, A1c, lipid panel, CBC -Also received Tdap vaccine today     Leeroy Bock, DO [facility]

## 2020-07-19 NOTE — Patient Instructions (Signed)
It was a pleasure to see you today!  To summarize our discussion for this visit:  I have put in a new referral for a different pain management clinic  I have updated your medication list which includes the Flexeril and Wellbutrin.  Please let me know if there is anything else I can do to help you with smoking cessation  We are checking some labs today for health maintenance as well as I ordered a bone scan and a mammogram  You will receive your Tdap vaccine today  It appears that you are due for your Pap smear.  Please look through your records to see if this has been done within the past few years and get me the records if possible.  Please return to complete your Pap smear if it has not been done.  Some additional health maintenance measures we should update are: Health Maintenance Due  Topic Date Due  . Hepatitis C Screening  Never done  . HIV Screening  Never done  . TETANUS/TDAP  Never done  . PAP SMEAR-Modifier  Never done  . INFLUENZA VACCINE  07/16/2020  .    Call the clinic at [phone] if your symptoms worsen or you have any concerns.   Thank you for allowing me to take part in your care,  Dr. Jamelle Rushing

## 2020-07-20 LAB — HEPATITIS C ANTIBODY: Hep C Virus Ab: <0.1 s/co ratio (ref 0.0–0.9)

## 2020-07-20 LAB — COMPREHENSIVE METABOLIC PANEL
ALT: 13 IU/L (ref 0–32)
AST: 21 IU/L (ref 0–40)
Albumin/Globulin Ratio: 1.8 (ref 1.2–2.2)
Albumin: 4.8 g/dL (ref 3.8–4.8)
Alkaline Phosphatase: 94 IU/L (ref 48–121)
BUN/Creatinine Ratio: 15 (ref 9–23)
BUN: 12 mg/dL (ref 6–24)
Bilirubin Total: 0.3 mg/dL (ref 0.0–1.2)
CO2: 24 mmol/L (ref 20–29)
Calcium: 9.7 mg/dL (ref 8.7–10.2)
Chloride: 97 mmol/L (ref 96–106)
Creatinine, Ser: 0.81 mg/dL (ref 0.57–1.00)
GFR calc Af Amer: 99 mL/min/{1.73_m2} (ref >59)
GFR calc non Af Amer: 86 mL/min/{1.73_m2} (ref >59)
Globulin, Total: 2.7 g/dL (ref 1.5–4.5)
Glucose: 95 mg/dL (ref 65–99)
Potassium: 4.7 mmol/L (ref 3.5–5.2)
Sodium: 135 mmol/L (ref 134–144)
Total Protein: 7.5 g/dL (ref 6.0–8.5)

## 2020-07-20 LAB — LIPID PANEL
Chol/HDL Ratio: 5.2 ratio — ABNORMAL HIGH (ref 0.0–4.4)
Cholesterol, Total: 309 mg/dL — ABNORMAL HIGH (ref 100–199)
HDL: 59 mg/dL (ref >39)
LDL Chol Calc (NIH): 216 mg/dL — ABNORMAL HIGH (ref 0–99)
Triglycerides: 180 mg/dL — ABNORMAL HIGH (ref 0–149)
VLDL Cholesterol Cal: 34 mg/dL (ref 5–40)

## 2020-07-20 LAB — CBC
Hematocrit: 41.4 % (ref 34.0–46.6)
Hemoglobin: 14.2 g/dL (ref 11.1–15.9)
MCH: 32.1 pg (ref 26.6–33.0)
MCHC: 34.3 g/dL (ref 31.5–35.7)
MCV: 94 fL (ref 79–97)
Platelets: 309 10*3/uL (ref 150–450)
RBC: 4.42 x10E6/uL (ref 3.77–5.28)
RDW: 12.3 % (ref 11.7–15.4)
WBC: 9.2 10*3/uL (ref 3.4–10.8)

## 2020-07-20 LAB — TSH: TSH: 2.72 u[IU]/mL (ref 0.450–4.500)

## 2020-07-20 LAB — HIV ANTIBODY (ROUTINE TESTING W REFLEX): HIV Screen 4th Generation wRfx: NONREACTIVE

## 2020-07-21 ENCOUNTER — Other Ambulatory Visit: Payer: Self-pay | Admitting: Student in an Organized Health Care Education/Training Program

## 2020-07-21 DIAGNOSIS — Z0000 Encounter for general adult medical examination without abnormal findings: Secondary | ICD-10-CM

## 2020-07-21 DIAGNOSIS — Z1231 Encounter for screening mammogram for malignant neoplasm of breast: Secondary | ICD-10-CM

## 2020-07-21 DIAGNOSIS — E2839 Other primary ovarian failure: Secondary | ICD-10-CM

## 2020-07-26 DIAGNOSIS — R030 Elevated blood-pressure reading, without diagnosis of hypertension: Secondary | ICD-10-CM | POA: Insufficient documentation

## 2020-07-26 DIAGNOSIS — Z716 Tobacco abuse counseling: Secondary | ICD-10-CM | POA: Insufficient documentation

## 2020-07-26 NOTE — Assessment & Plan Note (Signed)
Patient is current smoker 1ppd and would like to try Wellbutrin.  Failed Chantix in the past.

## 2020-07-26 NOTE — Assessment & Plan Note (Signed)
158/80.  Not currently being treated.  Recheck at next visit and consider starting therapy if still elevated

## 2020-07-26 NOTE — Assessment & Plan Note (Signed)
Referred to new pain management clinic for further therapy.  Refilled Flexeril. Patient is open to water PT. -Referral to [facility] PT

## 2020-07-26 NOTE — Assessment & Plan Note (Addendum)
Multiple screenings today ordered including: Mammogram, DEXA (risk factors include smoking and H/oh steroid use), hep C, HIV, TSH, A1c, lipid panel, CBC -Also received Tdap vaccine today

## 2020-08-10 DIAGNOSIS — M25511 Pain in right shoulder: Secondary | ICD-10-CM | POA: Diagnosis not present

## 2020-08-10 DIAGNOSIS — M25552 Pain in left hip: Secondary | ICD-10-CM | POA: Diagnosis not present

## 2020-08-10 DIAGNOSIS — G890 Central pain syndrome: Secondary | ICD-10-CM | POA: Diagnosis not present

## 2020-08-10 DIAGNOSIS — M25561 Pain in right knee: Secondary | ICD-10-CM | POA: Diagnosis not present

## 2020-08-10 DIAGNOSIS — M25562 Pain in left knee: Secondary | ICD-10-CM | POA: Diagnosis not present

## 2020-08-10 DIAGNOSIS — M545 Low back pain: Secondary | ICD-10-CM | POA: Diagnosis not present

## 2020-08-10 DIAGNOSIS — M542 Cervicalgia: Secondary | ICD-10-CM | POA: Diagnosis not present

## 2020-08-10 DIAGNOSIS — M25512 Pain in left shoulder: Secondary | ICD-10-CM | POA: Diagnosis not present

## 2020-08-10 DIAGNOSIS — M961 Postlaminectomy syndrome, not elsewhere classified: Secondary | ICD-10-CM | POA: Diagnosis not present

## 2020-08-10 DIAGNOSIS — M25551 Pain in right hip: Secondary | ICD-10-CM | POA: Diagnosis not present

## 2020-08-11 ENCOUNTER — Telehealth: Payer: Self-pay

## 2020-08-11 NOTE — Telephone Encounter (Signed)
Patient calls nurse line requesting lab results from 8/4. Please advise.

## 2020-08-14 ENCOUNTER — Other Ambulatory Visit: Payer: Self-pay | Admitting: Student in an Organized Health Care Education/Training Program

## 2020-08-14 MED ORDER — TOPIRAMATE 200 MG PO TABS: 200.0000 mg | ORAL_TABLET | Freq: Two times a day (BID) | ORAL | 1 refills | Status: DC | PRN

## 2020-08-14 MED ORDER — PRAVASTATIN SODIUM 20 MG PO TABS: 20.0000 mg | ORAL_TABLET | Freq: Every day | ORAL | 0 refills | Status: DC

## 2020-08-14 NOTE — Progress Notes (Signed)
-   Medications that were sent in at our last appointment were canceled due to the incorrect pharmacy.  I have sent her medications to the correct pharmacy today.  This includes Topamax and pravastatin 20.  - Pravastatin 20 is a new medication for the patient is we have checked her lipids and oriented severely elevated.  She has a history of intolerance of some cholesterol medications in the distant past but does not recall the name of them and they are not in her chart as she is new to the area.  She is amenable to starting pravastatin 20 and following up in a couple of months or sooner if she has any problems with it. -All her other labs were reviewed with patient and normal. -Patient has not heard from the referral to pain medicine yet so I informed her where the referral had been sent and she can contact them herself. She had no other concerns or questions at this time

## 2020-08-14 NOTE — Telephone Encounter (Signed)
Called patient

## 2020-08-17 ENCOUNTER — Encounter: Payer: Self-pay | Admitting: Physical Medicine and Rehabilitation

## 2020-08-31 DIAGNOSIS — M25561 Pain in right knee: Secondary | ICD-10-CM | POA: Diagnosis not present

## 2020-08-31 DIAGNOSIS — M545 Low back pain: Secondary | ICD-10-CM | POA: Diagnosis not present

## 2020-08-31 DIAGNOSIS — M961 Postlaminectomy syndrome, not elsewhere classified: Secondary | ICD-10-CM | POA: Diagnosis not present

## 2020-08-31 DIAGNOSIS — G890 Central pain syndrome: Secondary | ICD-10-CM | POA: Diagnosis not present

## 2020-08-31 DIAGNOSIS — M25562 Pain in left knee: Secondary | ICD-10-CM | POA: Diagnosis not present

## 2020-08-31 DIAGNOSIS — M25552 Pain in left hip: Secondary | ICD-10-CM | POA: Diagnosis not present

## 2020-08-31 DIAGNOSIS — M25551 Pain in right hip: Secondary | ICD-10-CM | POA: Diagnosis not present

## 2020-08-31 DIAGNOSIS — M25512 Pain in left shoulder: Secondary | ICD-10-CM | POA: Diagnosis not present

## 2020-08-31 DIAGNOSIS — M542 Cervicalgia: Secondary | ICD-10-CM | POA: Diagnosis not present

## 2020-08-31 DIAGNOSIS — M25511 Pain in right shoulder: Secondary | ICD-10-CM | POA: Diagnosis not present

## 2020-09-04 ENCOUNTER — Telehealth: Payer: Self-pay | Admitting: Student in an Organized Health Care Education/Training Program

## 2020-09-04 NOTE — Telephone Encounter (Signed)
I do not recall discussing getting a CT scan of patient's neck. It is also not documented. We discussed PT and pain clinic referrals as well as prescribed a muscle relaxer which are all documented.

## 2020-09-04 NOTE — Telephone Encounter (Signed)
Patient calls requesting orders be put in for her to get a CT scan or her neck. Patients says this was supposed to be done at her last visit with Dr. Dareen Piano, but hasn't been scheduled her anything. Please let patient when this has been done.

## 2020-09-05 ENCOUNTER — Telehealth: Payer: Self-pay | Admitting: Student in an Organized Health Care Education/Training Program

## 2020-09-05 NOTE — Telephone Encounter (Signed)
Patient called office and was informed of message from provider concerning wanted CT scan.  Patient states that it was discussed at last visit to her recollection.  She indorses vision and speech changes.  Patient "drops things that she is trying to hold".  Patient is aware that she has severe nerve damage in her neck.  States that she is in agony and would love for Dr. Dareen Piano to call her ASAP.  Glennie Hawk, CMA

## 2020-09-05 NOTE — Telephone Encounter (Signed)
Left voice message for patient to call office and receive message form doctor.  Glennie Hawk, CMA

## 2020-09-07 NOTE — Telephone Encounter (Signed)
Patient will need an appointment for these new symptoms or be seen in ED if these are an acute change.

## 2020-09-08 NOTE — Telephone Encounter (Signed)
Please call patient.  She is awaiting PCP's call.  Thanks

## 2020-09-19 ENCOUNTER — Other Ambulatory Visit: Payer: Self-pay | Admitting: Anesthesiology

## 2020-09-19 ENCOUNTER — Encounter: Payer: Medicaid Other | Admitting: Physical Medicine and Rehabilitation

## 2020-09-19 DIAGNOSIS — M5030 Other cervical disc degeneration, unspecified cervical region: Secondary | ICD-10-CM

## 2020-09-19 DIAGNOSIS — M542 Cervicalgia: Secondary | ICD-10-CM

## 2020-10-02 ENCOUNTER — Other Ambulatory Visit: Payer: Self-pay

## 2020-10-02 ENCOUNTER — Ambulatory Visit
Admission: RE | Admit: 2020-10-02 | Discharge: 2020-10-02 | Disposition: A | Discharge status: Home / Self Care | Payer: Medicaid Other | Source: Ambulatory Visit | Attending: Anesthesiology | Admitting: Anesthesiology

## 2020-10-02 DIAGNOSIS — M542 Cervicalgia: Secondary | ICD-10-CM

## 2020-10-02 DIAGNOSIS — M5030 Other cervical disc degeneration, unspecified cervical region: Secondary | ICD-10-CM

## 2020-10-02 DIAGNOSIS — M4802 Spinal stenosis, cervical region: Secondary | ICD-10-CM | POA: Diagnosis not present

## 2020-10-02 IMAGING — MR MR CERVICAL SPINE W/O CM
5 series · 37 of 48 positions shown · non-contrast
Comparison: None.

CLINICAL DATA: Cervicalgia. Numbness in the fingers. Weakness in
the hands.

EXAM:
MRI CERVICAL SPINE WITHOUT CONTRAST
TECHNIQUE: Multiplanar, multisequence MR imaging of the cervical spine was
performed. No intravenous contrast was administered.

[Series 2: T2 · sagittal · 3.0mm · 0.41mm/px · 7 of 17 slices shown (1 of 2)]
[im 1/17]
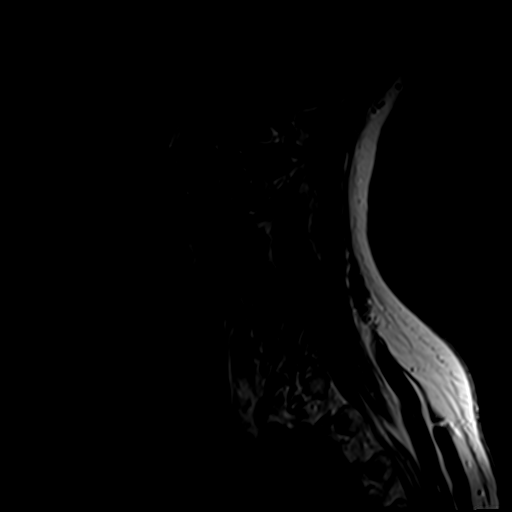
[im 3/17]
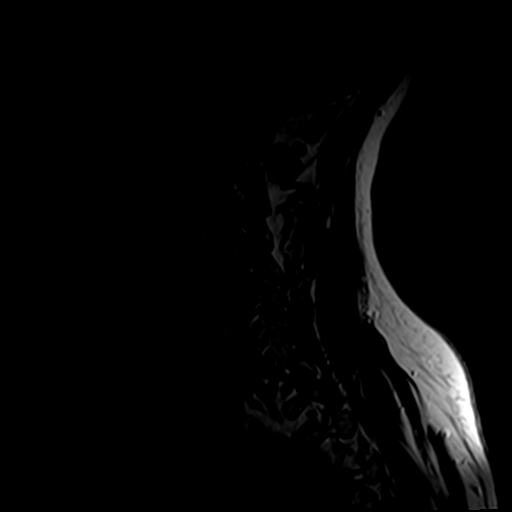
[im 6/17]
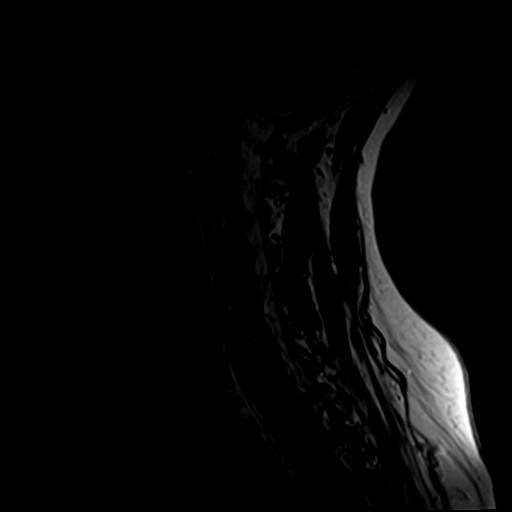
[im 9/17]
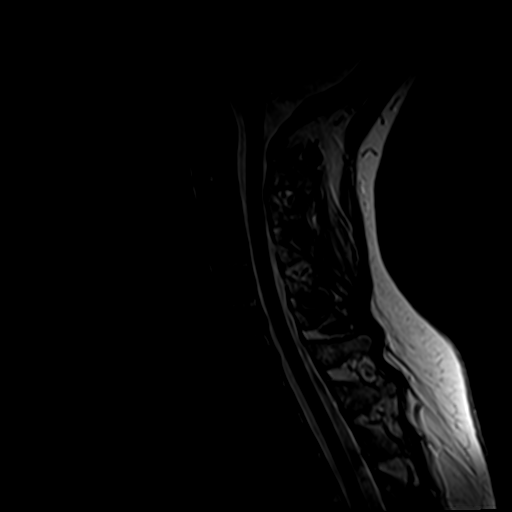
[im 11/17]
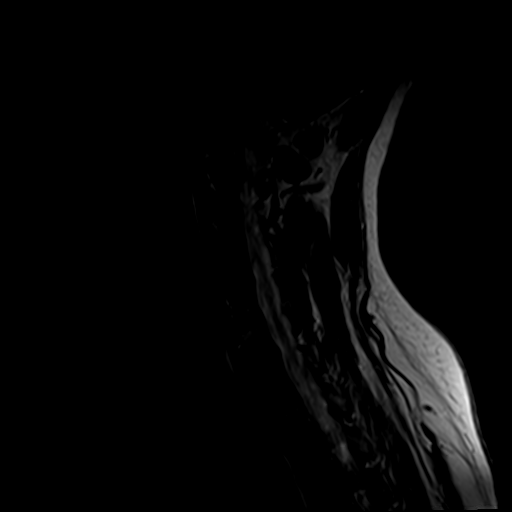
[im 14/17]
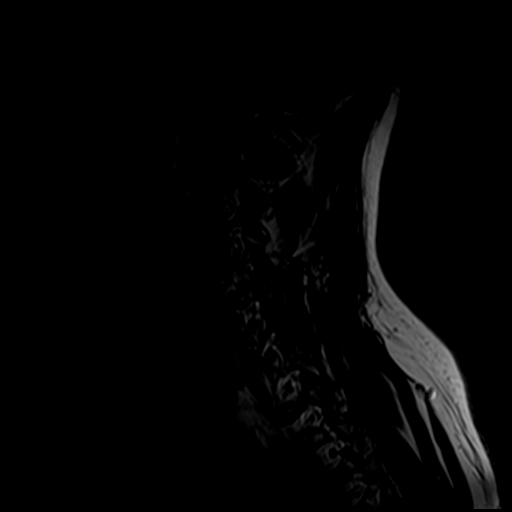
[im 17/17]
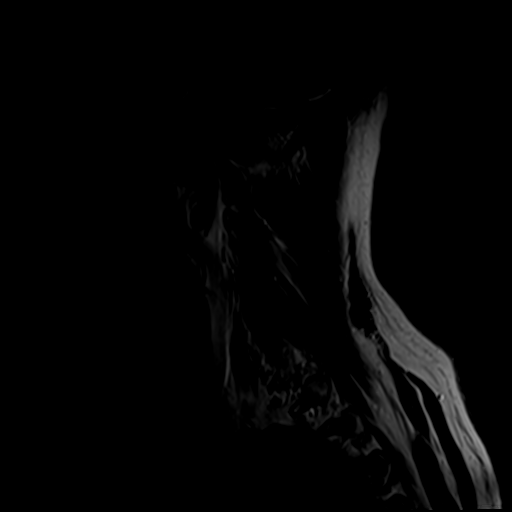

[Series 3: STIR · sagittal · 3.0mm · 0.82mm/px · 8 of 17 slices shown]
[im 1/17]
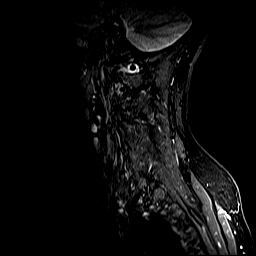
[im 3/17]
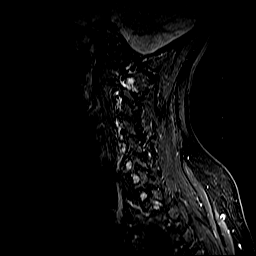
[im 5/17]
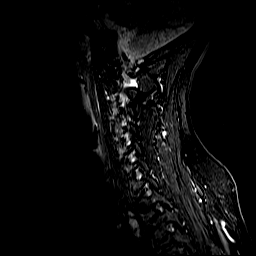
[im 7/17]
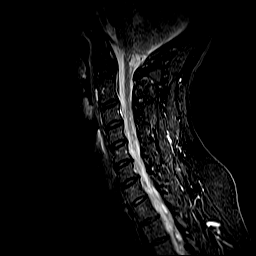
[im 10/17]
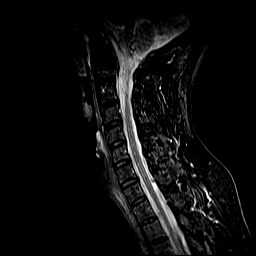
[im 12/17]
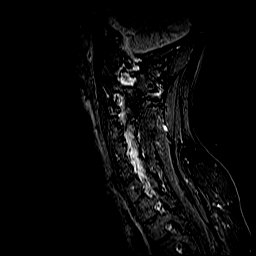
[im 14/17]
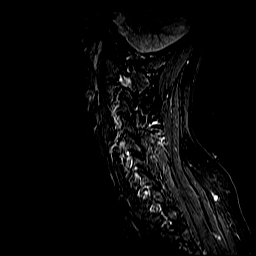
[im 17/17]
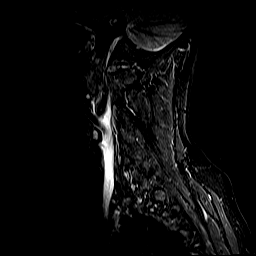

[Series 4: T1 · sagittal · 3.0mm · 0.82mm/px · 8 of 17 slices shown]
[im 1/17]
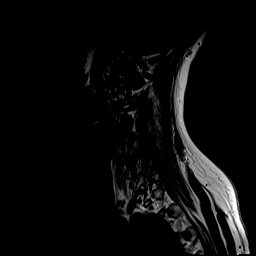
[im 3/17]
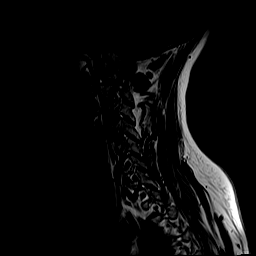
[im 5/17]
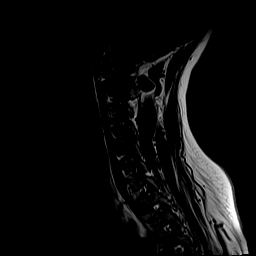
[im 7/17]
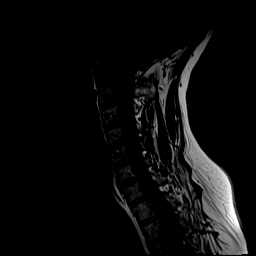
[im 10/17]
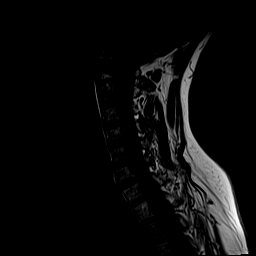
[im 12/17]
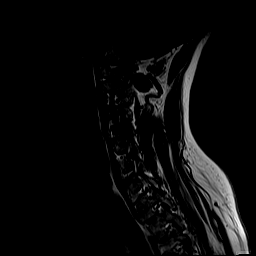
[im 14/17]
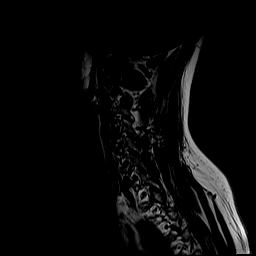
[im 17/17]
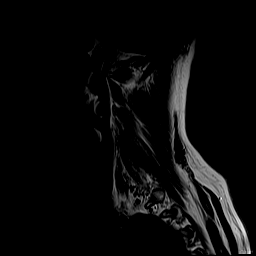

[Series 5: T2 · axial · 3.0mm · 0.70mm/px · z∈[-28,+73]mm · 9 of 25 slices shown (2 of 2)]
[im 1/25]
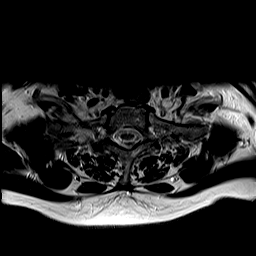
[im 5/25]
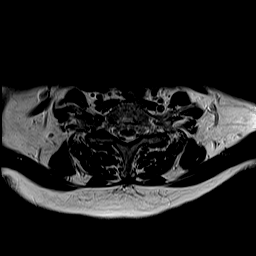
[im 7/25]
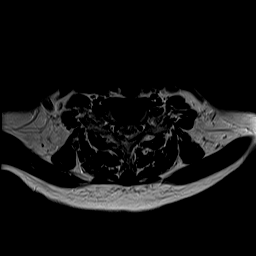
[im 11/25]
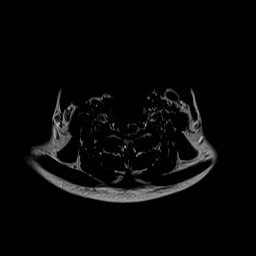
[im 14/25]
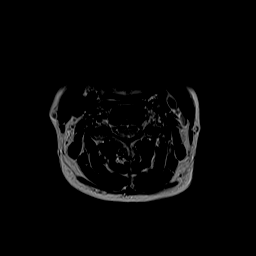
[im 18/25]
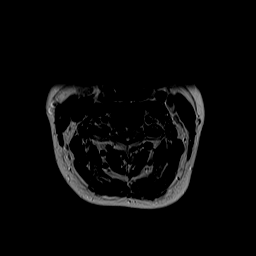
[im 20/25]
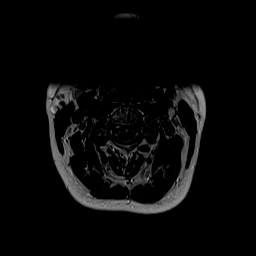
[im 22/25]
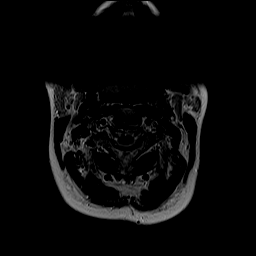
[im 25/25]
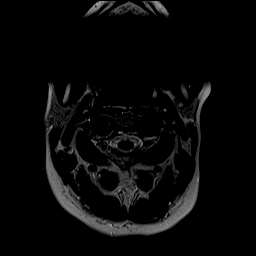

[Series 6: GRE · axial · 3.0mm · 0.35mm/px · z∈[-28,+28]mm · 5 of 28 slices shown]
[im 1/28]
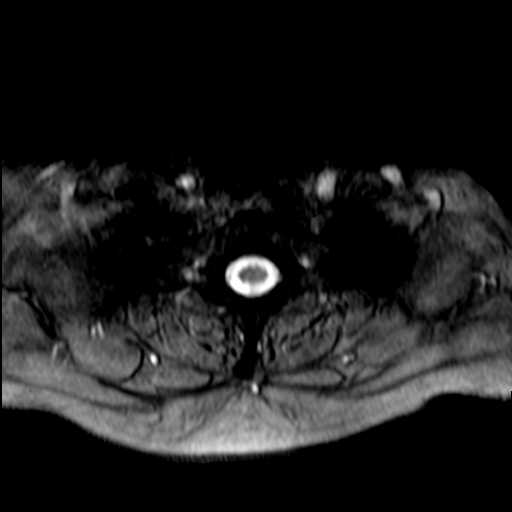
[im 5/28]
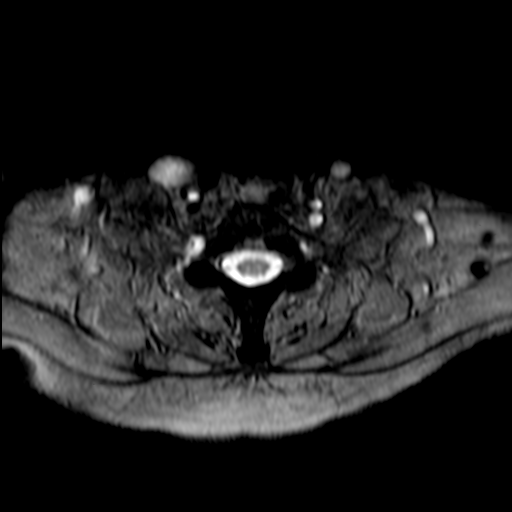
[im 10/28]
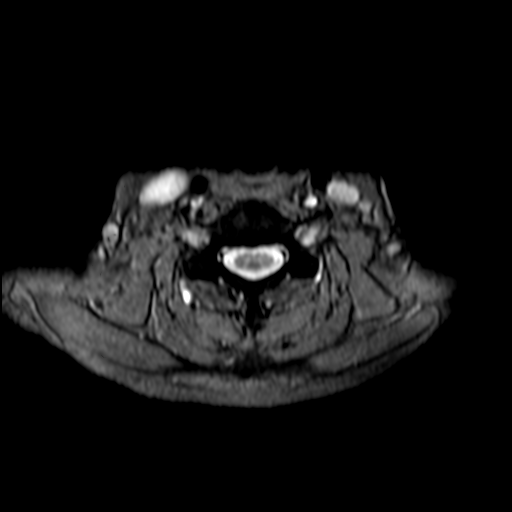
[im 12/28]
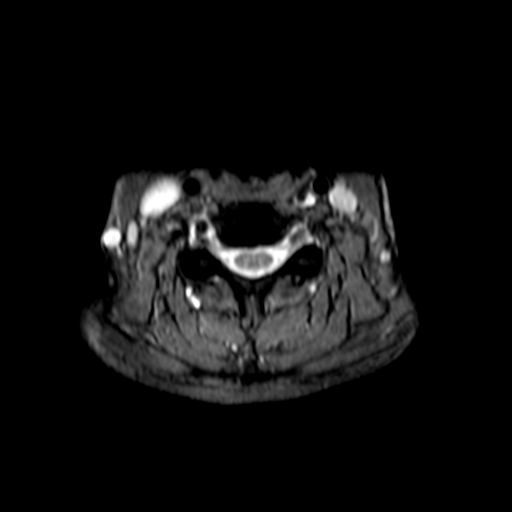
[im 16/28]
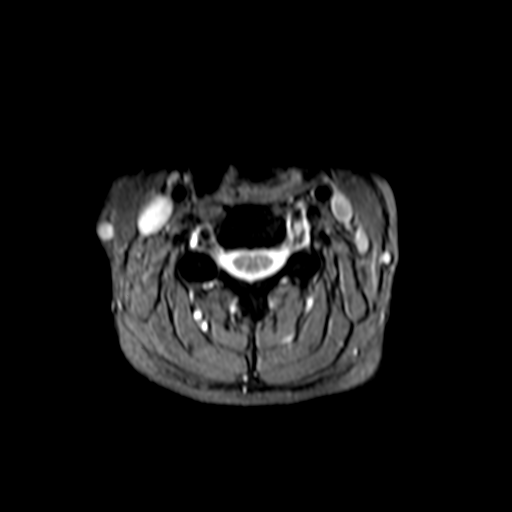

[37 of 48 positions shown; findings below may reference images not displayed]

FINDINGS: Alignment: Normal.

Vertebrae: No fracture, suspicious osseous lesion, or significant
marrow edema.

Cord: Normal signal and morphology.

Posterior Fossa, vertebral arteries, paraspinal tissues:
Unremarkable.

Disc levels:

C2-3: Negative.

C3-4: Mild right uncovertebral spurring without disc herniation or
stenosis.

C4-5: Mild uncovertebral spurring without disc herniation or
stenosis.

C5-6: Tiny right paracentral disc protrusion without stenosis.

C6-7: Mild disc bulging greater to the right without stenosis.

C7-T1: Negative.
IMPRESSION: Mild cervical spondylosis without stenosis.

## 2020-10-11 DIAGNOSIS — M542 Cervicalgia: Secondary | ICD-10-CM | POA: Diagnosis not present

## 2020-10-11 DIAGNOSIS — M25552 Pain in left hip: Secondary | ICD-10-CM | POA: Diagnosis not present

## 2020-10-11 DIAGNOSIS — M25561 Pain in right knee: Secondary | ICD-10-CM | POA: Diagnosis not present

## 2020-10-11 DIAGNOSIS — M25562 Pain in left knee: Secondary | ICD-10-CM | POA: Diagnosis not present

## 2020-10-11 DIAGNOSIS — M25512 Pain in left shoulder: Secondary | ICD-10-CM | POA: Diagnosis not present

## 2020-10-11 DIAGNOSIS — M25551 Pain in right hip: Secondary | ICD-10-CM | POA: Diagnosis not present

## 2020-10-11 DIAGNOSIS — M545 Low back pain, unspecified: Secondary | ICD-10-CM | POA: Diagnosis not present

## 2020-10-11 DIAGNOSIS — M25511 Pain in right shoulder: Secondary | ICD-10-CM | POA: Diagnosis not present

## 2020-10-11 DIAGNOSIS — G890 Central pain syndrome: Secondary | ICD-10-CM | POA: Diagnosis not present

## 2020-10-11 DIAGNOSIS — M961 Postlaminectomy syndrome, not elsewhere classified: Secondary | ICD-10-CM | POA: Diagnosis not present

## 2020-10-12 ENCOUNTER — Other Ambulatory Visit: Payer: Self-pay | Admitting: Student in an Organized Health Care Education/Training Program

## 2020-10-18 ENCOUNTER — Encounter: Payer: Medicaid Other | Admitting: Physical Medicine and Rehabilitation

## 2020-10-23 ENCOUNTER — Ambulatory Visit: Payer: Medicaid Other

## 2020-10-23 ENCOUNTER — Other Ambulatory Visit: Payer: Medicaid Other

## 2020-10-25 ENCOUNTER — Encounter: Payer: Self-pay | Admitting: Physical Medicine and Rehabilitation

## 2020-10-25 ENCOUNTER — Encounter
Payer: Medicaid Other | Attending: Physical Medicine and Rehabilitation | Admitting: Physical Medicine and Rehabilitation

## 2020-10-25 ENCOUNTER — Other Ambulatory Visit: Payer: Self-pay

## 2020-10-25 VITALS — BP 148/79 | HR 99 | Temp 98.5°F | Ht 64.0 in | Wt 145.2 lb

## 2020-10-25 DIAGNOSIS — M546 Pain in thoracic spine: Secondary | ICD-10-CM | POA: Diagnosis not present

## 2020-10-25 DIAGNOSIS — R200 Anesthesia of skin: Secondary | ICD-10-CM | POA: Diagnosis not present

## 2020-10-25 DIAGNOSIS — Z462 Encounter for fitting and adjustment of other devices related to nervous system and special senses: Secondary | ICD-10-CM | POA: Insufficient documentation

## 2020-10-25 DIAGNOSIS — G510 Bell's palsy: Secondary | ICD-10-CM | POA: Diagnosis not present

## 2020-10-25 DIAGNOSIS — G43811 Other migraine, intractable, with status migrainosus: Secondary | ICD-10-CM

## 2020-10-25 MED ORDER — AMITRIPTYLINE HCL 10 MG PO TABS: 10.0000 mg | ORAL_TABLET | Freq: Every day | ORAL | 1 refills | Status: AC
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
## 2020-11-06 DIAGNOSIS — Z79899 Other long term (current) drug therapy: Secondary | ICD-10-CM | POA: Diagnosis not present

## 2020-11-06 DIAGNOSIS — M0609 Rheumatoid arthritis without rheumatoid factor, multiple sites: Secondary | ICD-10-CM | POA: Diagnosis not present

## 2020-11-08 DIAGNOSIS — M25512 Pain in left shoulder: Secondary | ICD-10-CM | POA: Diagnosis not present

## 2020-11-08 DIAGNOSIS — M542 Cervicalgia: Secondary | ICD-10-CM | POA: Diagnosis not present

## 2020-11-08 DIAGNOSIS — M25511 Pain in right shoulder: Secondary | ICD-10-CM | POA: Diagnosis not present

## 2020-11-08 DIAGNOSIS — M545 Low back pain, unspecified: Secondary | ICD-10-CM | POA: Diagnosis not present

## 2020-11-08 DIAGNOSIS — M25552 Pain in left hip: Secondary | ICD-10-CM | POA: Diagnosis not present

## 2020-11-08 DIAGNOSIS — G890 Central pain syndrome: Secondary | ICD-10-CM | POA: Diagnosis not present

## 2020-11-08 DIAGNOSIS — M961 Postlaminectomy syndrome, not elsewhere classified: Secondary | ICD-10-CM | POA: Diagnosis not present

## 2020-11-08 DIAGNOSIS — M25551 Pain in right hip: Secondary | ICD-10-CM | POA: Diagnosis not present

## 2020-11-08 DIAGNOSIS — M25561 Pain in right knee: Secondary | ICD-10-CM | POA: Diagnosis not present

## 2020-11-08 DIAGNOSIS — M25562 Pain in left knee: Secondary | ICD-10-CM | POA: Diagnosis not present

## 2020-11-12 ENCOUNTER — Other Ambulatory Visit: Payer: Self-pay | Admitting: Student in an Organized Health Care Education/Training Program

## 2020-11-17 ENCOUNTER — Ambulatory Visit
Admission: RE | Admit: 2020-11-17 | Discharge: 2020-11-17 | Disposition: A | Discharge status: Home / Self Care | Payer: Medicaid Other | Source: Ambulatory Visit | Attending: Physical Medicine and Rehabilitation | Admitting: Physical Medicine and Rehabilitation

## 2020-11-17 ENCOUNTER — Other Ambulatory Visit: Payer: Self-pay

## 2020-11-17 DIAGNOSIS — G629 Polyneuropathy, unspecified: Secondary | ICD-10-CM | POA: Diagnosis not present

## 2020-11-17 DIAGNOSIS — R200 Anesthesia of skin: Secondary | ICD-10-CM | POA: Diagnosis not present

## 2020-11-17 DIAGNOSIS — M546 Pain in thoracic spine: Secondary | ICD-10-CM

## 2020-11-17 DIAGNOSIS — F8081 Childhood onset fluency disorder: Secondary | ICD-10-CM | POA: Diagnosis not present

## 2020-11-17 DIAGNOSIS — G510 Bell's palsy: Secondary | ICD-10-CM

## 2020-11-17 DIAGNOSIS — R42 Dizziness and giddiness: Secondary | ICD-10-CM | POA: Diagnosis not present

## 2020-11-17 IMAGING — MR MR HEAD WO/W CM
14 series · 44 of 48 positions shown · IV contrast (multihance)
Comparison: None.

CLINICAL DATA: Dizziness. Cranial neuropathy (CN 7). Numbness in
face, stutter, subacute.

EXAM:
MRI HEAD WITHOUT AND WITH CONTRAST
TECHNIQUE: Multiplanar, multiecho pulse sequences of the brain and surrounding
structures were obtained without and with intravenous contrast.
CONTRAST:  13mL MULTIHANCE GADOBENATE DIMEGLUMINE 529 MG/ML IV SOLN

[Series 2: T1 · sagittal · 5.0mm · 0.45mm/px · 3 of 21 slices shown (1 of 3)]
[im 1/21]
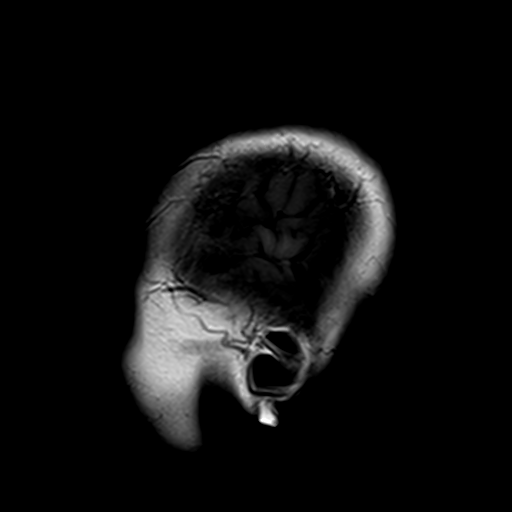
[im 11/21]
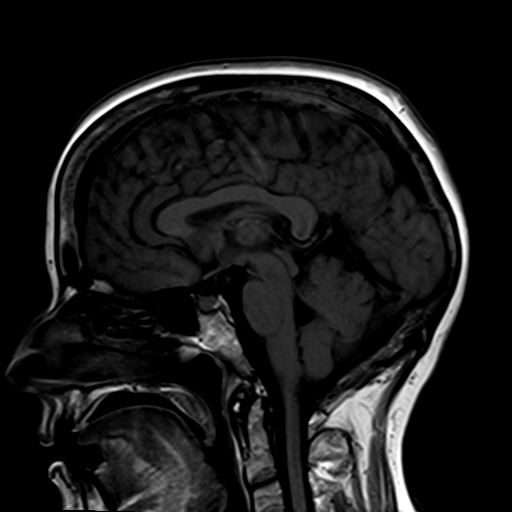
[im 21/21]
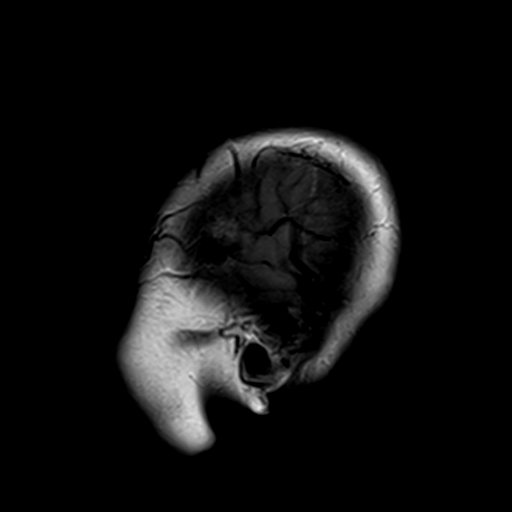

[Series 3: DWI · axial · 3.0mm · 1.80mm/px · z∈[-57,+90]mm · 9 of 100 slices shown (1 of 4)]
[im 1/100]
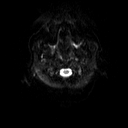
[im 13/100]
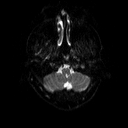
[im 25/100]
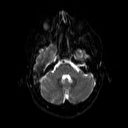
[im 38/100]
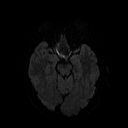
[im 50/100]
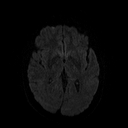
[im 62/100]
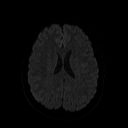
[im 75/100]
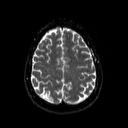
[im 87/100]
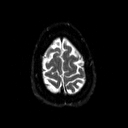
[im 100/100]
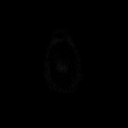

[Series 4: DWI · axial · 3.0mm · 1.80mm/px · z∈[-57,+90]mm · 5 of 48 slices shown (2 of 4)]
[im 1/48]
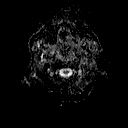
[im 12/48]
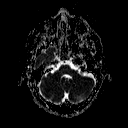
[im 24/48]
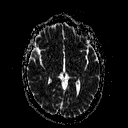
[im 36/48]
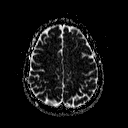
[im 48/48]
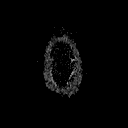

[Series 5: DWI · coronal · 5.0mm · 1.80mm/px · 6 of 68 slices shown (3 of 4)]
[im 1/68]
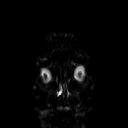
[im 14/68]
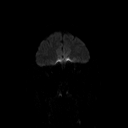
[im 27/68]
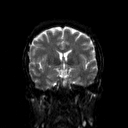
[im 41/68]
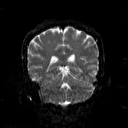
[im 54/68]
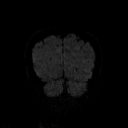
[im 68/68]
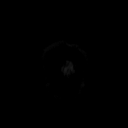

[Series 6: DWI · coronal · 5.0mm · 1.80mm/px · 3 of 34 slices shown (4 of 4)]
[im 1/34]
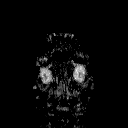
[im 17/34]
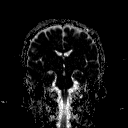
[im 34/34]
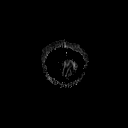

[Series 7: T2 · axial · 5.0mm · 0.60mm/px · z∈[-53,+88]mm · 2 of 22 slices shown]
[im 1/22]
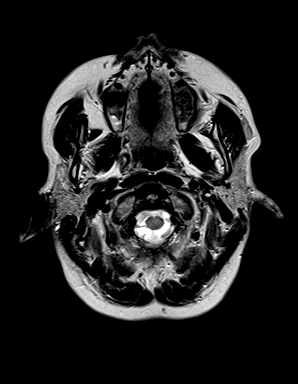
[im 22/22]
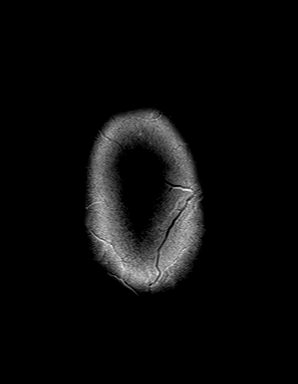

[Series 8: FLAIR · axial · 3.0mm · 0.45mm/px · z∈[-51,+84]mm · 3 of 30 slices shown]
[im 1/30]
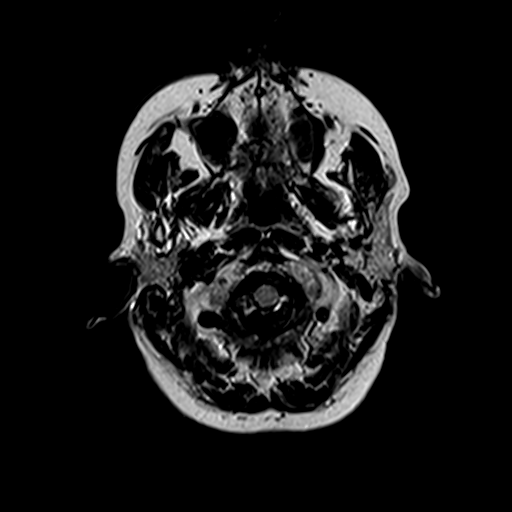
[im 15/30]
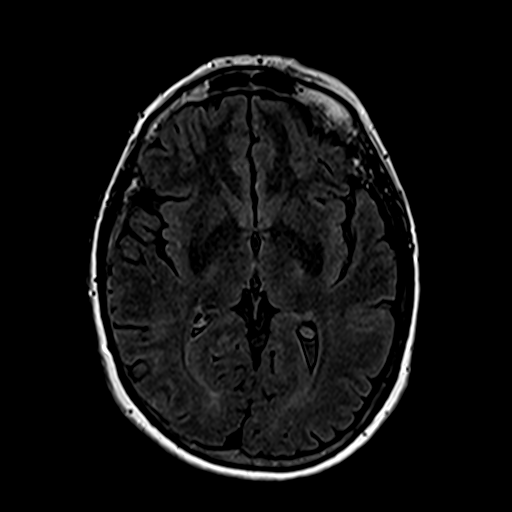
[im 30/30]
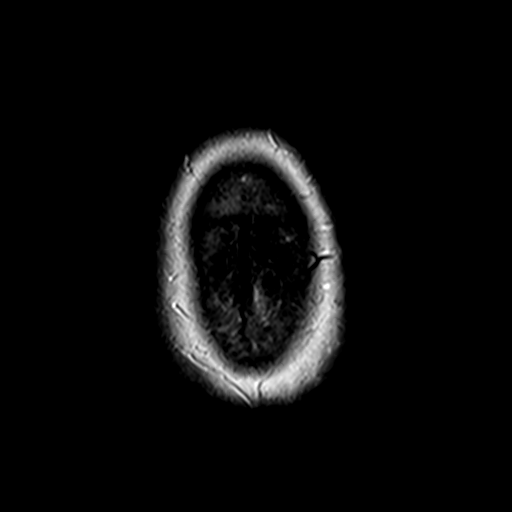

[Series 10: swi_images · axial · 4.0mm · 0.90mm/px · z∈[-53,+87]mm · 3 of 36 slices shown]
[im 1/36]
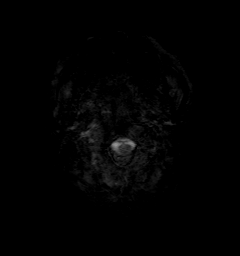
[im 18/36]
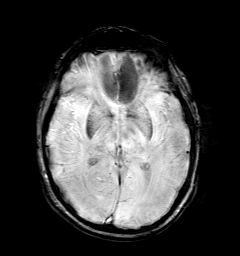
[im 36/36]
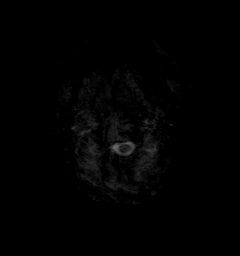

[Series 11: T1 · coronal · 3.0mm · 0.35mm/px · 1 of 11 slices shown (2 of 3)]
[im 1/11]
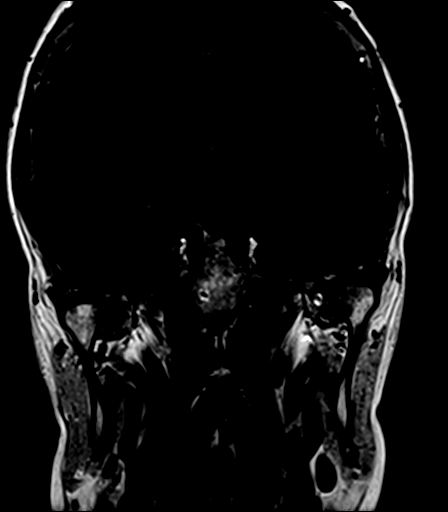

[Series 12: T1 · axial · 3.0mm · 0.35mm/px · 1 of 11 slices shown (3 of 3)]
[im 1/11]
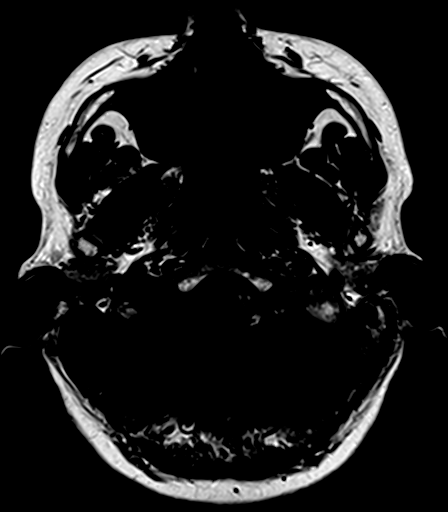

[Series 13: bSSFP · axial · 1.0mm · 0.28mm/px · z∈[-43,-8]mm · 3 of 36 slices shown]
[im 1/36]
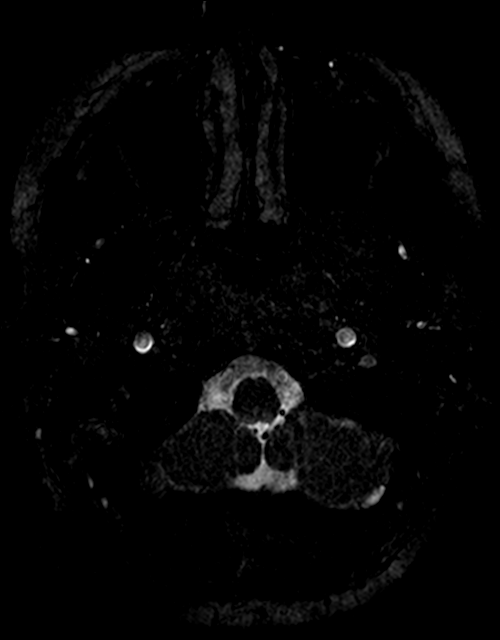
[im 18/36]
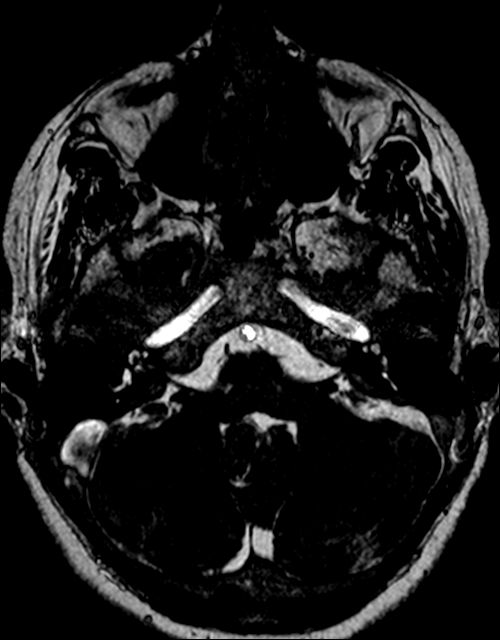
[im 36/36]
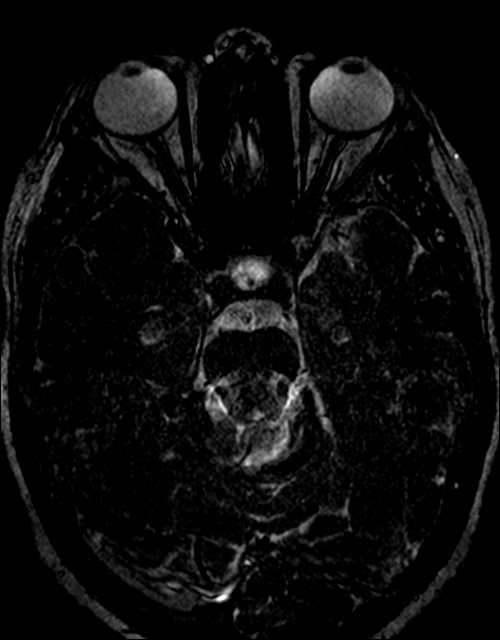

[Series 14: T1 post-contrast · coronal · 3.0mm · 0.35mm/px · 1 of 11 slices shown (1 of 2)]
[im 1/11]
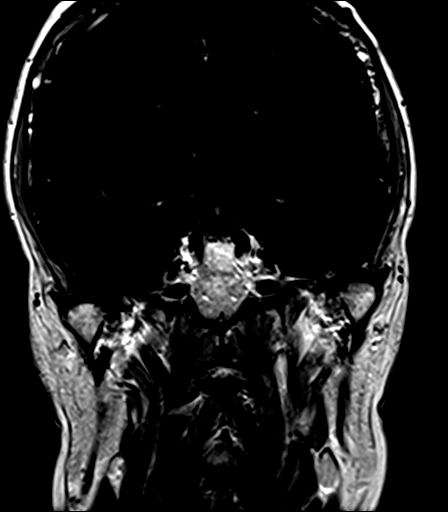

[Series 15: T1 post-contrast · axial · 3.0mm · 0.35mm/px · 1 of 11 slices shown (2 of 2)]
[im 1/11]
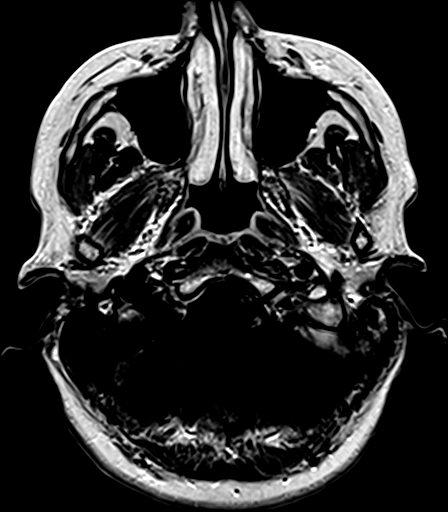

[Series 16: post_t1_mpr_tra · axial · 2.0mm · 0.45mm/px · z∈[-53,-7]mm · 3 of 72 slices shown]
[im 1/72]
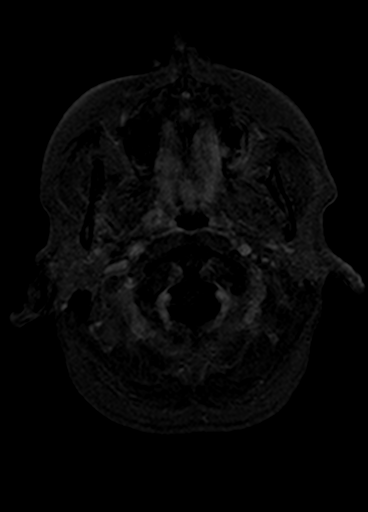
[im 12/72]
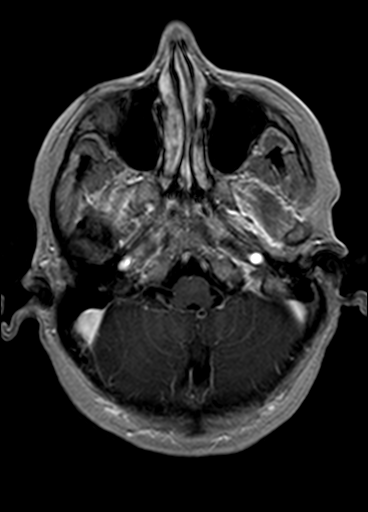
[im 24/72]
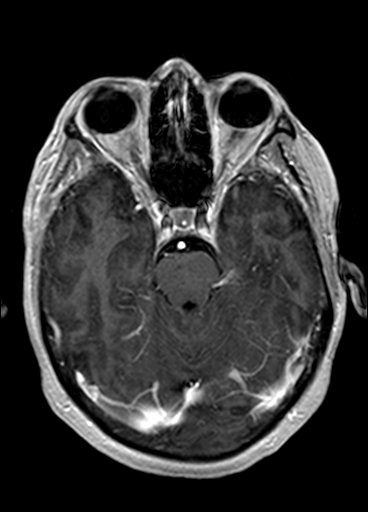

[44 of 48 positions shown; findings below may reference images not displayed]

FINDINGS: Brain: No acute infarction, hemorrhage, hydrocephalus, extra-axial
collection or mass lesion. A few small foci of T2 hyperintensity are
seen within the white matter of the cerebral hemispheres,
nonspecific. No focus of abnormal contrast enhancement.

No cerebellopontine angle mass or internal auditory canal lesion is
demonstrated.Normal appearance of the 7th and 8th cranial nerves
bilaterally.

Vascular: Normal flow voids.

Skull and upper cervical spine: Normal marrow signal.

Sinuses/Orbits: Negative.

Other: None.
IMPRESSION: 1. No cerebellopontine angle mass or internal auditory canal lesion.
2. A few small foci of T2 hyperintensity within the white matter of
the cerebral hemispheres, nonspecific. These may represent mild
chronic microvascular ischemic changes, migraine headaches and post
inflammatory/infectious processes.

## 2020-11-17 IMAGING — CR DG THORACIC SPINE 2V
3 series · 3 of 3 positions shown · non-contrast
Comparison: None.

CLINICAL DATA: Thoracic spinal pain

EXAM:
THORACIC SPINE 2 VIEWS

[w thoracic spine ap]
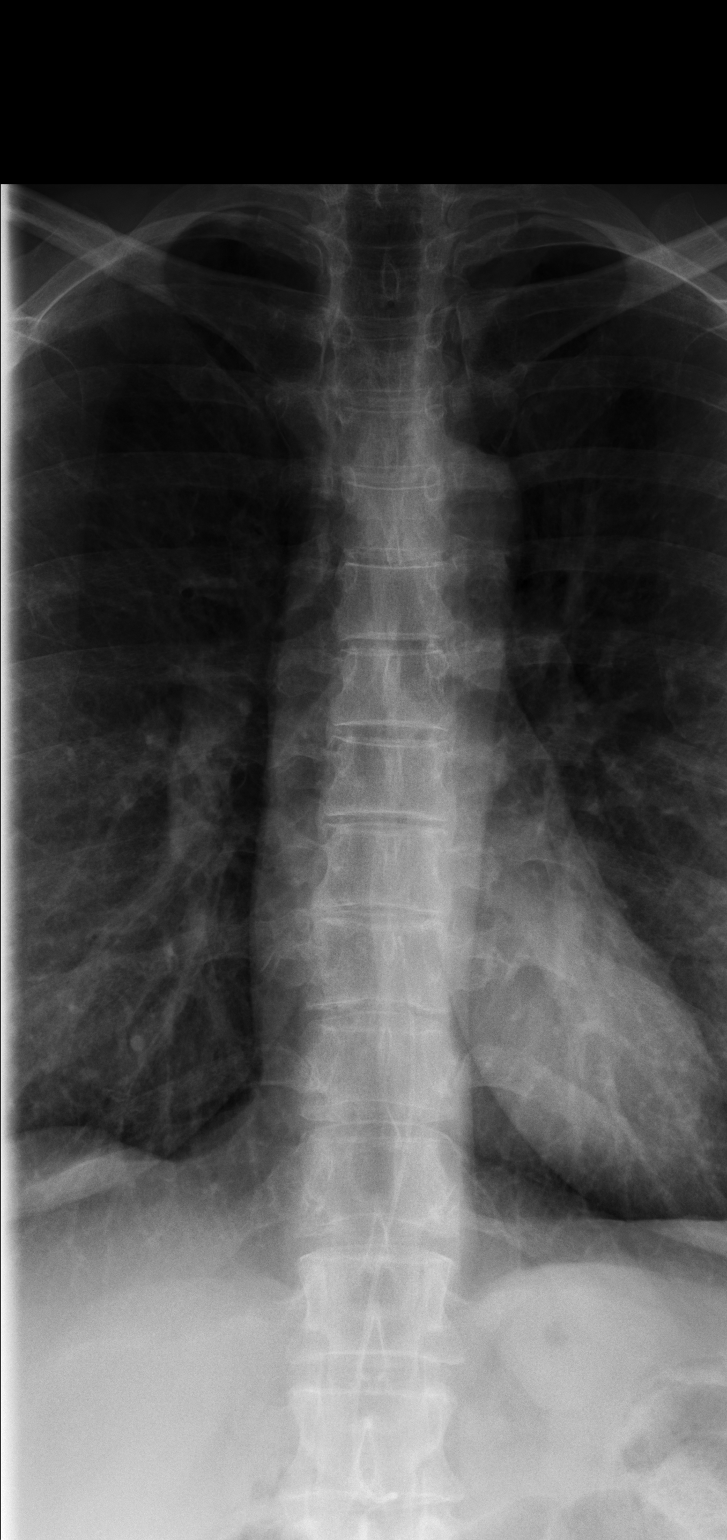

[w thoracic spine lat (1 of 2)]
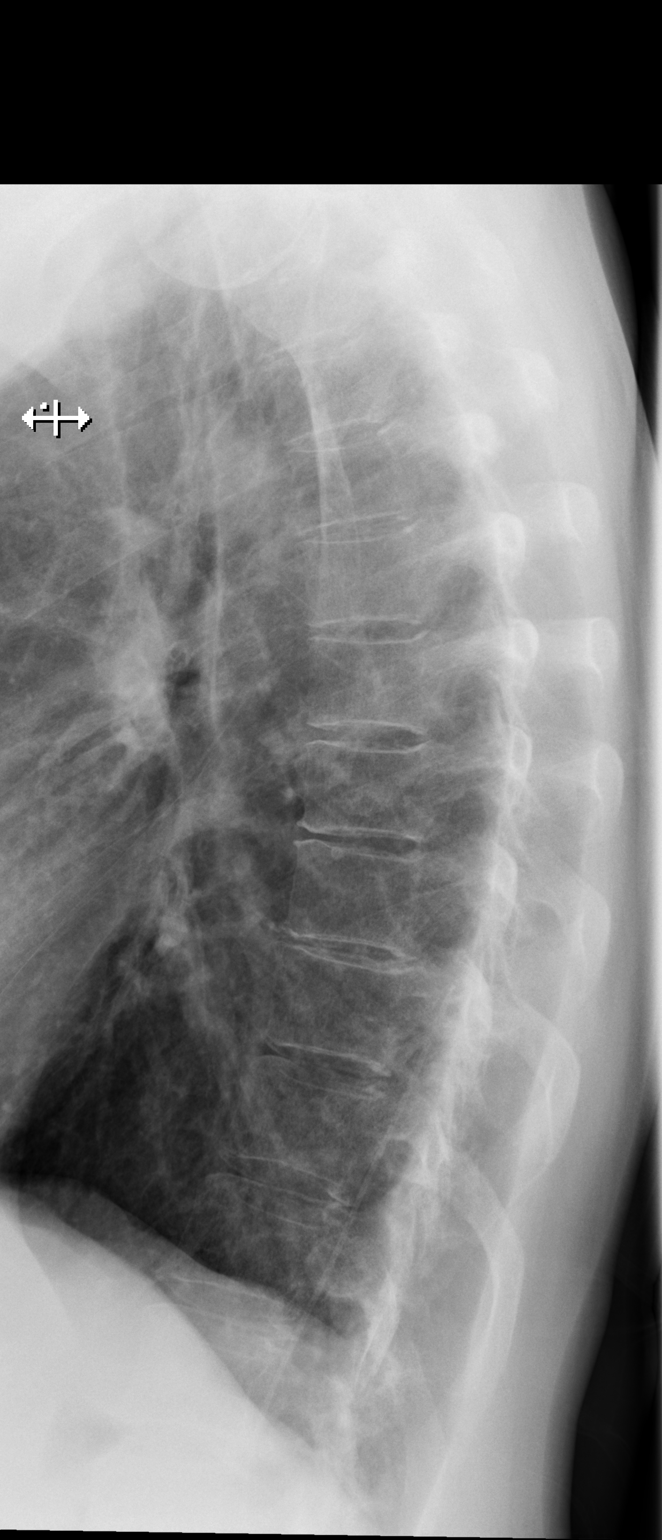

[w thoracic spine lat (2 of 2)]
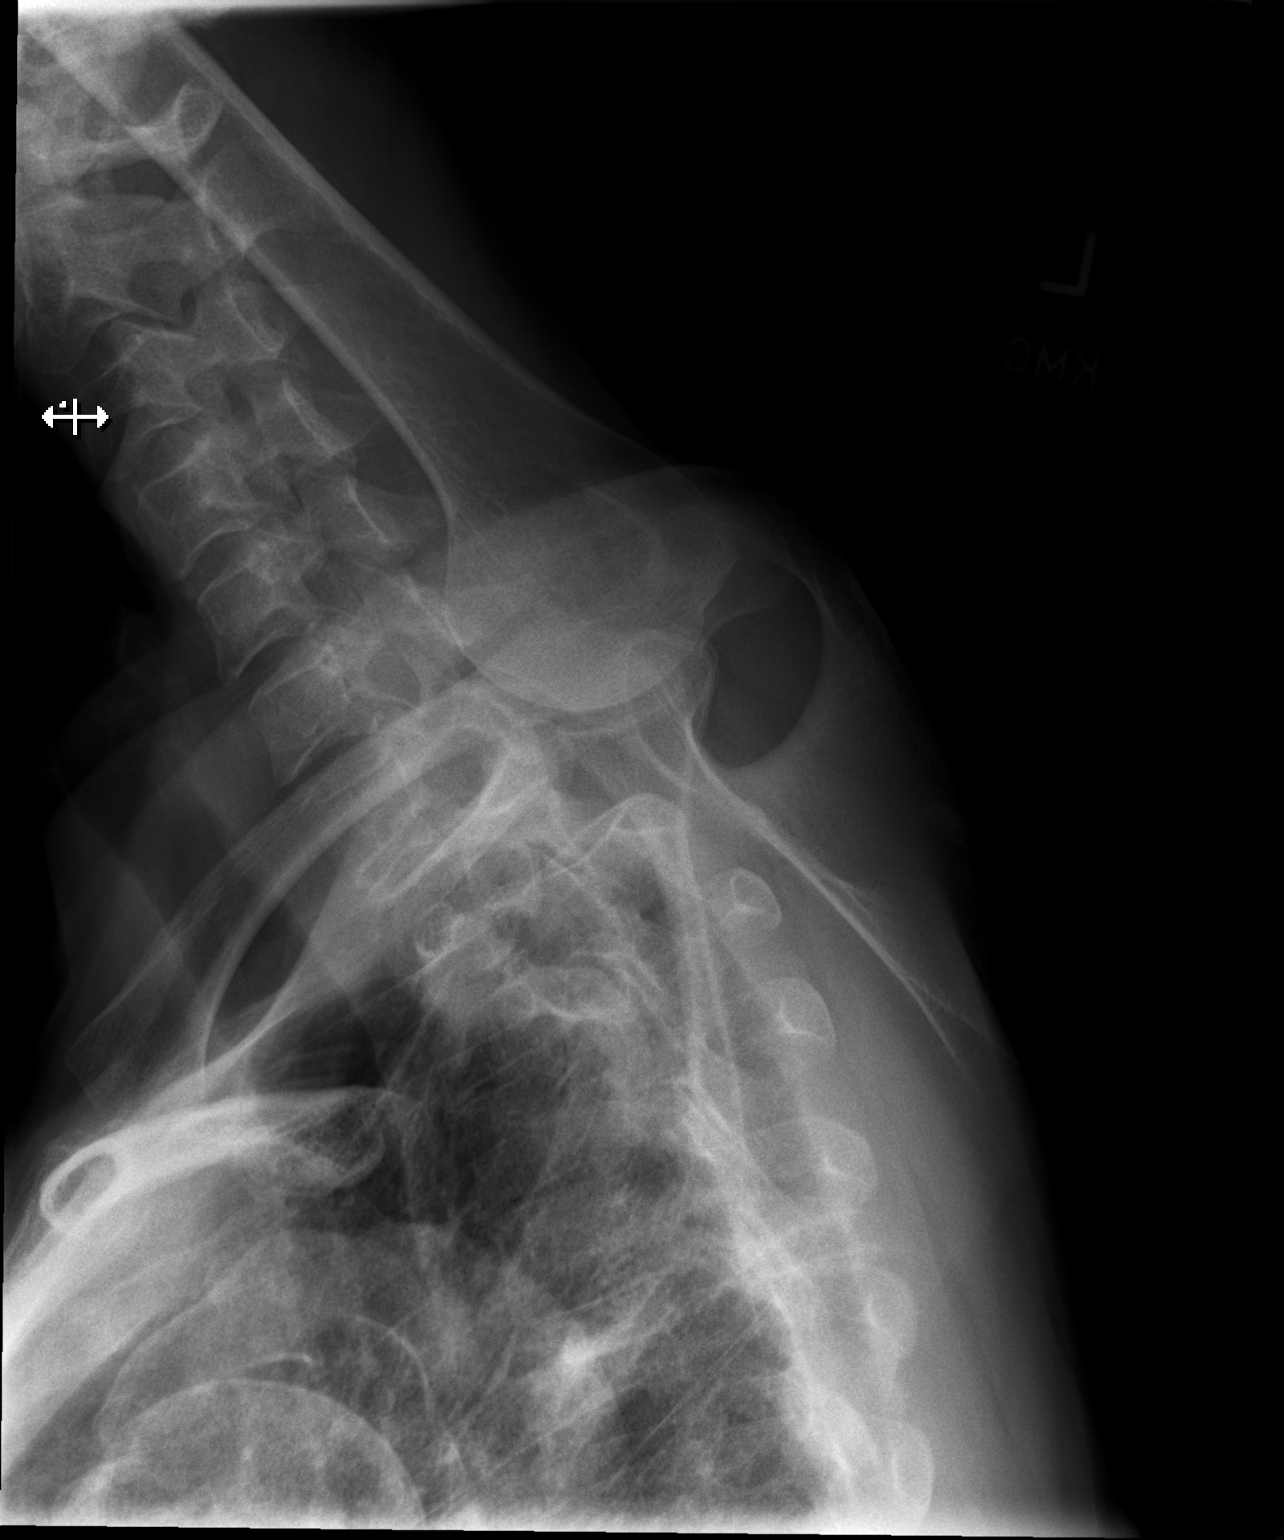

[3 of 3 positions shown; findings below may reference images not displayed]

FINDINGS: There is no evidence of thoracic spine fracture. Alignment is
normal. No other significant bone abnormalities are identified.
IMPRESSION: Negative.

## 2020-11-17 MED ORDER — GADOBENATE DIMEGLUMINE 529 MG/ML IV SOLN
13.0000 mL | Freq: Once | INTRAVENOUS | Status: AC | PRN
  Administered 2020-11-17: 13 mL via INTRAVENOUS

## 2020-11-23 ENCOUNTER — Telehealth: Payer: Self-pay

## 2020-11-23 NOTE — Telephone Encounter (Signed)
Alexandria Osborn called to discuss her recent imaging results. Call back phone number is [phone] or [phone].  Thank you

## 2020-11-29 ENCOUNTER — Encounter
Payer: Medicaid Other | Attending: Physical Medicine and Rehabilitation | Admitting: Physical Medicine and Rehabilitation

## 2020-11-29 ENCOUNTER — Other Ambulatory Visit: Payer: Self-pay

## 2020-11-29 ENCOUNTER — Encounter: Payer: Self-pay | Admitting: Physical Medicine and Rehabilitation

## 2020-11-29 VITALS — BP 150/80 | HR 106 | Ht 64.0 in | Wt 147.0 lb

## 2020-11-29 DIAGNOSIS — G43811 Other migraine, intractable, with status migrainosus: Secondary | ICD-10-CM | POA: Diagnosis not present

## 2020-11-29 DIAGNOSIS — M546 Pain in thoracic spine: Secondary | ICD-10-CM | POA: Diagnosis not present

## 2020-11-29 DIAGNOSIS — Z462 Encounter for fitting and adjustment of other devices related to nervous system and special senses: Secondary | ICD-10-CM | POA: Diagnosis not present

## 2020-11-29 DIAGNOSIS — R200 Anesthesia of skin: Secondary | ICD-10-CM

## 2020-11-29 DIAGNOSIS — J321 Chronic frontal sinusitis: Secondary | ICD-10-CM | POA: Diagnosis not present

## 2020-11-29 DIAGNOSIS — G510 Bell's palsy: Secondary | ICD-10-CM

## 2020-11-29 MED ORDER — DULOXETINE HCL 20 MG PO CPEP: 20.0000 mg | ORAL_CAPSULE | Freq: Every day | ORAL | 1 refills | Status: DC

## 2020-11-29 NOTE — Progress Notes (Signed)
° ° °  SUBJECTIVE:   CHIEF COMPLAINT / HPI:   Sinus infection: Occurs every year about this time for the past four years after getting pneumonia. Current symptoms have been ongoing for approximately 1 month. Patient states she is coughing up green sputum.  Sore throat after coughing or waking up with sore throat. She has a mild frontal headache. She denies fever, but states her autoimmune disorder she does not always get fevers with infections. Patient wants to know she can get the Covid booster today. Patient not taking anything for the cough or congestion.  PERTINENT  PMH / PSH: Rheumatoid arthritis, ankylosing spondylitis  OBJECTIVE:   BP 120/76    Pulse (!) 108    Ht 5\' 4"  (1.626 m)    SpO2 99%    BMI 25.23 kg/m   General: Alert, oriented. Standing up. Mild discomfort from back pain HEENT: Moist mucosa, no oral pharyngeal erythema or exudates. Mild frontal sinus tenderness to palpation, L greater than R. PERRLA. CV: Regular rate and rhythm, no murmurs Pulmonary: Lungs clear auscultation bilaterally no wheezing or crackles. GI: Soft, nontender to palpation  ASSESSMENT/PLAN:   Bacterial sinusitis Not convinced that this is true bacterial sinusitis, but also cannot rule it out given the patient's description of symptoms and her tenderness over the sinuses. Given the duration of her issues, will opt to treat with antibiotics today. 10 days of Augmentin. Patient eligible to receive Covid booster today, which was given before she left.. Patient advised to return if symptoms do not get better or worsen.     , MD [facility]

## 2020-11-29 NOTE — Patient Instructions (Signed)
Benefits of Tulsi  -"Queen of Herbs" native to [place]  -Rich in Vitamins A,C, K, calcium, phosphorus, iron, potassium, protein, fiber  -Boosts immunity  -Reduces pain  -Reduces cold, cough, respiratory disorders  -Reduces stress and blood pressure  -Anti-cancerous properties  -Lowers blood glucose levels  -Prevents kidney stones and gout  -Alleviates indigestion, loss of appetite, flatulence, bloating  -Good for skin and hair  -Eases pain of insect bites  -Strengthens teeth and gums  -Treats eczema, itching, irritation  -Reduces stress and fatigue 
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
## 2020-11-30 ENCOUNTER — Ambulatory Visit (INDEPENDENT_AMBULATORY_CARE_PROVIDER_SITE_OTHER): Payer: Medicaid Other | Admitting: Family Medicine

## 2020-11-30 VITALS — BP 120/76 | HR 108 | Ht 64.0 in

## 2020-11-30 DIAGNOSIS — B9689 Other specified bacterial agents as the cause of diseases classified elsewhere: Secondary | ICD-10-CM | POA: Diagnosis not present

## 2020-11-30 DIAGNOSIS — Z23 Encounter for immunization: Secondary | ICD-10-CM

## 2020-11-30 DIAGNOSIS — J329 Chronic sinusitis, unspecified: Secondary | ICD-10-CM | POA: Insufficient documentation

## 2020-11-30 MED ORDER — AMOXICILLIN-POT CLAVULANATE 875-125 MG PO TABS: 1.0000 | ORAL_TABLET | Freq: Two times a day (BID) | ORAL | 0 refills | Status: AC

## 2020-11-30 NOTE — Patient Instructions (Signed)
It was nice to meet you today,  I have prescribed you Augmentin to take twice a day for the next 10 days for bacterial sinusitis.  We have also given you your Covid booster today.  You may feel worse after the Covid booster for a day or 2, but continue taking the antibiotic for 10 days.  Please call us if you have any questions or concerns while taking this medication.  Have a great day,  Frederic Jericho, MD

## 2020-11-30 NOTE — Assessment & Plan Note (Signed)
Not convinced that this is true bacterial sinusitis, but also cannot rule it out given the patient's description of symptoms and her tenderness over the sinuses. Given the duration of her issues, will opt to treat with antibiotics today. 10 days of Augmentin. Patient eligible to receive Covid booster today, which was given before she left.. Patient advised to return if symptoms do not get better or worsen.

## 2020-12-01 ENCOUNTER — Other Ambulatory Visit: Payer: Self-pay | Admitting: Physical Medicine and Rehabilitation

## 2020-12-01 DIAGNOSIS — F8081 Childhood onset fluency disorder: Secondary | ICD-10-CM

## 2020-12-01 DIAGNOSIS — S069X0S Unspecified intracranial injury without loss of consciousness, sequela: Secondary | ICD-10-CM

## 2020-12-10 ENCOUNTER — Other Ambulatory Visit: Payer: Self-pay | Admitting: Student in an Organized Health Care Education/Training Program

## 2020-12-14 DIAGNOSIS — M5416 Radiculopathy, lumbar region: Secondary | ICD-10-CM | POA: Diagnosis not present

## 2020-12-14 DIAGNOSIS — R202 Paresthesia of skin: Secondary | ICD-10-CM | POA: Diagnosis not present

## 2020-12-14 DIAGNOSIS — Z981 Arthrodesis status: Secondary | ICD-10-CM | POA: Diagnosis not present

## 2020-12-14 DIAGNOSIS — Z8739 Personal history of other diseases of the musculoskeletal system and connective tissue: Secondary | ICD-10-CM | POA: Diagnosis not present

## 2020-12-19 ENCOUNTER — Telehealth: Payer: Self-pay

## 2020-12-19 NOTE — Telephone Encounter (Signed)
Patient calls nurse line reporting continued symptoms post finishing Augmentin. Patient reports she feels like she now has pneumonia. Patient reports cough and fatigue. Patient denies fever or SOB. Patient received booster on 12/16. Will forward to [facility] for advisement, as he saw patient for original visit.

## 2020-12-21 NOTE — Telephone Encounter (Signed)
If she is having new respiratory symptoms she should be seen in the [facility] clinic but if she is just saying her previous symptoms didn't get any better she can be seen by anyone.

## 2020-12-21 NOTE — Telephone Encounter (Signed)
Attempted to call to schedule. No answer or option for VM.

## 2020-12-28 ENCOUNTER — Other Ambulatory Visit: Payer: Self-pay

## 2020-12-28 ENCOUNTER — Ambulatory Visit (INDEPENDENT_AMBULATORY_CARE_PROVIDER_SITE_OTHER): Payer: Medicaid Other | Admitting: Family Medicine

## 2020-12-28 ENCOUNTER — Encounter: Payer: Self-pay | Admitting: Family Medicine

## 2020-12-28 VITALS — BP 122/78 | HR 98 | Ht 64.0 in | Wt 147.4 lb

## 2020-12-28 DIAGNOSIS — R053 Chronic cough: Secondary | ICD-10-CM | POA: Diagnosis not present

## 2020-12-28 MED ORDER — CETIRIZINE HCL 10 MG PO TABS: 10.0000 mg | ORAL_TABLET | Freq: Every day | ORAL | 11 refills | Status: AC

## 2020-12-28 NOTE — Progress Notes (Signed)
    SUBJECTIVE:   CHIEF COMPLAINT / HPI:  Patient still having persistent coughing since her last visit despite taking antibiotics.  She feels like the mucous gets 'stuck' in her chest but occasionally will produce greenish sputum.  She is taking her inhalers and was taking OTC benadryl but recently stopped.  She does not take otc cough medications.  She feels like her sinus pressure has improved somewhat. She does not take zyrtec or flonase.    PERTINENT  PMH / PSH: Sjogren syndrome  OBJECTIVE:   BP 122/78   Pulse 98   Ht 5\' 4"  (1.626 m)   Wt 147 lb 6.4 oz (66.9 kg)   SpO2 100%   BMI 25.30 kg/m   Gen: alert, oriented.  Cv: rrr. No murmurs Pulm: lctab.   ASSESSMENT/PLAN:   No problem-specific Assessment & Plan notes found for this encounter.     , MD [facility]  \

## 2020-12-28 NOTE — Assessment & Plan Note (Signed)
Pt has approximately 2 months of cough that is sometimes productive of sputum.  Recent augmentin did not help.  Possibly due to allergies or gerd.  Will have pt take daily zyrtec and get CXR to rule out lower respiratory infection pathology.

## 2020-12-28 NOTE — Patient Instructions (Signed)
It was nice to see you today,  I have ordered a chest x-ray to get a better look at your lungs.  I have also prescribed Zyrtec which I would like you to take once a day instead of Benadryl.  Take it once a day every day even if you feel like your symptoms are improving at least for the next 4 weeks.  Untreated allergies can often cause a chronic cough.  I would like to see you back in 2 to 3 weeks unless your symptoms have resolved.  Have a great day,  Frederic Jericho, MD

## 2020-12-29 ENCOUNTER — Encounter: Payer: Medicaid Other | Admitting: Physical Medicine and Rehabilitation

## 2021-01-08 DIAGNOSIS — Z978 Presence of other specified devices: Secondary | ICD-10-CM | POA: Diagnosis not present

## 2021-01-08 DIAGNOSIS — Z462 Encounter for fitting and adjustment of other devices related to nervous system and special senses: Secondary | ICD-10-CM | POA: Diagnosis not present

## 2021-01-08 DIAGNOSIS — M961 Postlaminectomy syndrome, not elsewhere classified: Secondary | ICD-10-CM | POA: Diagnosis not present

## 2021-01-30 DIAGNOSIS — Z48811 Encounter for surgical aftercare following surgery on the nervous system: Secondary | ICD-10-CM | POA: Diagnosis not present

## 2021-01-30 DIAGNOSIS — M5416 Radiculopathy, lumbar region: Secondary | ICD-10-CM | POA: Diagnosis not present

## 2021-01-30 DIAGNOSIS — Z981 Arthrodesis status: Secondary | ICD-10-CM | POA: Diagnosis not present

## 2021-01-30 DIAGNOSIS — M4326 Fusion of spine, lumbar region: Secondary | ICD-10-CM | POA: Diagnosis not present

## 2021-02-01 ENCOUNTER — Other Ambulatory Visit: Payer: Medicaid Other

## 2021-02-01 ENCOUNTER — Inpatient Hospital Stay: Admission: RE | Admit: 2021-02-01 | Payer: Medicaid Other | Source: Ambulatory Visit

## 2021-02-06 ENCOUNTER — Other Ambulatory Visit: Payer: Self-pay | Admitting: Family Medicine

## 2021-02-06 ENCOUNTER — Other Ambulatory Visit: Payer: Self-pay | Admitting: Physical Medicine and Rehabilitation

## 2021-02-07 ENCOUNTER — Encounter: Payer: Medicaid Other | Admitting: Physical Medicine and Rehabilitation

## 2021-02-14 ENCOUNTER — Encounter: Payer: Self-pay | Admitting: Neurology

## 2021-02-14 ENCOUNTER — Telehealth: Payer: Self-pay | Admitting: *Deleted

## 2021-02-14 ENCOUNTER — Ambulatory Visit: Payer: Medicaid Other | Admitting: Neurology

## 2021-02-14 ENCOUNTER — Telehealth: Payer: Self-pay | Admitting: Neurology

## 2021-02-14 NOTE — Progress Notes (Deleted)
[facility]    Provider:  Dr Lucia Gaskins Requesting Provider: Horton Chin, MD Primary Care Provider:  Leeroy Bock, DO  CC:  ***  HPI:  Alexandria Osborn is a 50 y.o. female here as requested by Carlis Abbott, Drema Pry, MD for intermittent stuttering. PMHx RA, mixed connective tissue disease, migraine, anxiety, ankylosing spondylitis, chronic pain, tobacco abuse, chronic pain, fibromyalgia. I reviewed Dr. Richmond Campbell notes: Patient has been having severe migraines, only 5 migraine days 3/month, she takes Topamax and gabapentin, failed amitriptyline, and is tried fentanyl without benefit, she has thoracic spine intercostal neuralgia, she has been diagnosed with mixed connective tissue disease, she has neck pain status post trigger point injections that made it worse, therapy made her pain worse, massage made the pain worse, she also has fibromyalgia.  Reviewed notes, labs and imaging from outside physicians, which showed:  07/19/2020: TSH, CBC normal  MRI brain 11/17/2020:  FINDINGS: Brain: No acute infarction, hemorrhage, hydrocephalus, extra-axial collection or mass lesion. A few small foci of T2 hyperintensity are seen within the white matter of the cerebral hemispheres, nonspecific. No focus of abnormal contrast enhancement.  No cerebellopontine angle mass or internal auditory canal lesion is demonstrated.Normal appearance of the 7th and 8th cranial nerves bilaterally.  Vascular: Normal flow voids.  Skull and upper cervical spine: Normal marrow signal.  Sinuses/Orbits: Negative.  Other: None.  IMPRESSION: 1. No cerebellopontine angle mass or internal auditory canal lesion. 2. A few small foci of T2 hyperintensity within the white matter of the cerebral hemispheres, nonspecific. These may represent mild chronic microvascular ischemic changes, migraine headaches and post inflammatory/infectious processes.  Review of Systems: Patient complains of  symptoms per HPI as well as the following symptoms ***. Pertinent negatives and positives per HPI. All others negative.   Social History   Socioeconomic History  . Marital status: Divorced    Spouse name: Not on file  . Number of children: Not on file  . Years of education: Not on file  . Highest education level: Not on file  Occupational History  . Not on file  Tobacco Use  . Smoking status: Current Every Day Smoker    Packs/day: 0.50  . Smokeless tobacco: Never Used  Substance and Sexual Activity  . Alcohol use: Never  . Drug use: Never  . Sexual activity: Not on file  Other Topics Concern  . Not on file  Social History Narrative   Lives with mother and dogs, cats   Social Determinants of Health   Financial Resource Strain: Not on file  Food Insecurity: Not on file  Transportation Needs: Not on file  Physical Activity: Not on file  Stress: Not on file  Social Connections: Not on file  Intimate Partner Violence: Not on file    Family History  Problem Relation Age of Onset  . Heart disease Mother   . Hyperlipidemia Mother   . Hypertension Mother   . Osteoporosis Mother   . Liver cancer Father   . Anxiety disorder Father   . Diabetes Father   . Hypertension Father   . Stroke Father   . Alcohol abuse Father   . Alcohol abuse Brother   . Drug abuse Brother   . Rheum arthritis Brother   . Anxiety disorder Brother   . Rheum arthritis Maternal Grandmother   . Lung cancer Maternal Grandmother   . Cervical cancer Maternal Grandmother   . Hyperlipidemia Maternal Grandmother   . Hypertension Maternal Grandmother   . Osteoporosis Maternal Grandmother   .  Stroke Maternal Grandmother     Past Medical History:  Diagnosis Date  . Anemia 1989  . Ankylosing spondylitis (HCC)   . Anorexia 1989  . Anxiety 2001  . Irregular heart beat 2003  . Migraine 1979  . Mixed connective tissue disease (HCC) 2017  . Rheumatoid arthritis (HCC) 2017  . Seasonal allergies 2006     Patient Active Problem List   Diagnosis Date Noted  . Chronic cough 12/28/2020  . Elevated blood pressure reading 07/26/2020  . Tobacco abuse counseling 07/26/2020  . Otitis externa 12/04/2019  . Presence of implanted infusion pump 07/01/2018  . Cardiac abnormality 07/01/2018  . Ankylosing spondylitis (HCC)   . Healthcare maintenance 05/17/2018  . Chronic pain syndrome 05/17/2018  . Rheumatoid arthritis (HCC) 12/17/2015  . Mixed connective tissue disease (HCC) 12/17/2015  . Seasonal allergies 12/16/2004  . Anemia 12/17/1987    Past Surgical History:  Procedure Laterality Date  . ABDOMINAL HYSTERECTOMY  1997   total   . BACK SURGERY  2003  . BACK SURGERY  2004   Corrected with L4-L5 fusion replacement  . BREAST ENHANCEMENT SURGERY Bilateral 1998  . INFUSION PUMP IMPLANTATION  2006    Current Outpatient Medications  Medication Sig Dispense Refill  . Adalimumab 40 MG/0.4ML PSKT Inject 1 pen into the skin every 14 (fourteen) days.    Marland Kitchen amitriptyline (ELAVIL) 10 MG tablet Take 1 tablet (10 mg total) by mouth at bedtime. 30 tablet 1  . amoxicillin-clavulanate (AUGMENTIN) 875-125 MG tablet Take 1 tablet by mouth 2 (two) times daily. 20 tablet 0  . buPROPion (WELLBUTRIN XL) 150 MG 24 hr tablet Take 1 tablet (150 mg total) by mouth daily. 60 tablet 1  . cetirizine (ZYRTEC) 10 MG tablet Take 1 tablet (10 mg total) by mouth daily. 30 tablet 11  . cyclobenzaprine (FLEXERIL) 10 MG tablet Take 1 tablet (10 mg total) by mouth 3 (three) times daily as needed for muscle spasms. 90 tablet 0  . cycloSPORINE (RESTASIS) 0.05 % ophthalmic emulsion Place 1 drop into both eyes 2 (two) times daily.    . DULoxetine (CYMBALTA) 20 MG capsule TAKE 1 CAPSULE(20 MG) BY MOUTH DAILY 30 capsule 1  . fentaNYL (DURAGESIC - DOSED MCG/HR) 100 MCG/HR Place 100 mcg onto the skin every 3 (three) days.    Marland Kitchen gabapentin (NEURONTIN) 800 MG tablet Take 800 mg by mouth 3 (three) times daily as needed.    . naloxone  (NARCAN) nasal spray 4 mg/0.1 mL Place 1 spray into the nose once as needed.    Marland Kitchen oxyCODONE (ROXICODONE) 15 MG immediate release tablet Take 15 mg by mouth 3 (three) times daily as needed for pain.     . pravastatin (PRAVACHOL) 20 MG tablet TAKE 1 TABLET(20 MG) BY MOUTH AT BEDTIME 90 tablet 1  . topiramate (TOPAMAX) 200 MG tablet TAKE 1 TABLET(200 MG) BY MOUTH TWICE DAILY AS NEEDED 60 tablet 1   No current facility-administered medications for this visit.    Allergies as of 02/14/2021 - Review Complete 12/28/2020  Allergen Reaction Noted  . Plaquenil [hydroxychloroquine sulfate] Hives 05/15/2018    Vitals: There were no vitals taken for this visit. Last Weight:  Wt Readings from Last 1 Encounters:  12/28/20 147 lb 6.4 oz (66.9 kg)   Last Height:   Ht Readings from Last 1 Encounters:  12/28/20 5\' 4"  (1.626 m)     Physical exam: Exam: Gen: NAD, conversant, well nourised, obese, well groomed  CV: RRR, no MRG. No Carotid Bruits. No peripheral edema, warm, nontender Eyes: Conjunctivae clear without exudates or hemorrhage  Neuro: Detailed Neurologic Exam  Speech:    Speech is normal; fluent and spontaneous with normal comprehension.  Cognition:    The patient is oriented to person, place, and time;     recent and remote memory intact;     language fluent;     normal attention, concentration,     fund of knowledge Cranial Nerves:    The pupils are equal, round, and reactive to light. The fundi are normal and spontaneous venous pulsations are present. Visual fields are full to finger confrontation. Extraocular movements are intact. Trigeminal sensation is intact and the muscles of mastication are normal. The face is symmetric. The palate elevates in the midline. Hearing intact. Voice is normal. Shoulder shrug is normal. The tongue has normal motion without fasciculations.   Coordination:    Normal finger to nose and heel to shin. Normal rapid alternating  movements.   Gait:    Heel-toe and tandem gait are normal.   Motor Observation:    No asymmetry, no atrophy, and no involuntary movements noted. Tone:    Normal muscle tone.    Posture:    Posture is normal. normal erect    Strength:    Strength is V/V in the upper and lower limbs.      Sensation: intact to LT     Reflex Exam:  DTR's:    Deep tendon reflexes in the upper and lower extremities are normal bilaterally.   Toes:    The toes are downgoing bilaterally.   Clonus:    Clonus is absent.    Assessment/Plan:    No orders of the defined types were placed in this encounter.  No orders of the defined types were placed in this encounter.   Cc: Horton Chin, MD,  Leeroy Bock, DO  Naomie Dean, MD  [facility] [address] [place], [place] [address]  Phone [phone] Fax [phone]

## 2021-02-14 NOTE — Telephone Encounter (Signed)
Patient referred by Horton Chin, MD to neurology with episodes of stuttering with unremarkable MRI brain.  Patient no showed her appointment today in neurology new patient.  If patient calls back to reschedule, please inform her of our policy for dismissal based on no-shows.  Also we have many patients waiting for new patient appointments, please discuss that with her. She can be seen by any physician here if she still feels she needs neurology.

## 2021-02-14 NOTE — Telephone Encounter (Signed)
Patient no showed new patient appt today.

## 2021-03-21 ENCOUNTER — Encounter
Payer: Medicaid Other | Attending: Physical Medicine and Rehabilitation | Admitting: Physical Medicine and Rehabilitation

## 2021-04-09 ENCOUNTER — Ambulatory Visit: Payer: Medicaid Other | Admitting: Neurology

## 2021-05-06 ENCOUNTER — Other Ambulatory Visit: Payer: Self-pay | Admitting: Student in an Organized Health Care Education/Training Program

## 2022-02-06 ENCOUNTER — Other Ambulatory Visit: Payer: Medicaid Other | Admitting: Obstetrics and Gynecology

## 2022-02-06 ENCOUNTER — Other Ambulatory Visit: Payer: Self-pay

## 2022-02-06 NOTE — Patient Outreach (Signed)
Care Coordination  02/06/2022  Ty Delahoz 1971/12/04 [id number]   Medicaid Managed Care   Unsuccessful Outreach Note  02/06/2022 Name: Alexandria Osborn MRN: [id number] DOB: 1971/01/21  An unsuccessful telephone outreach to offer case management/care coordination services was attempted today.   Plan: The care management team will reach out to the patient again over the next 7-14 days.   Aida Raider RN, BSN [place]   [facility] - Managed Medicaid High Risk [phone]

## 2022-02-06 NOTE — Patient Instructions (Signed)
Visit Information  Alexandria Osborn  - as a part of your Medicaid benefit, you are eligible for care management and care coordination services at no cost or copay. I was unable to reach you by phone today but would be happy to help you with your health related needs. Please feel free to call me at [phone]  A member of the Managed Medicaid care management team will reach out to you again over the next 7-14 days.   Kathi Der RN, BSN [place]   [facility], [employer] - Managed Medicaid High Risk [phone]

## 2022-02-20 ENCOUNTER — Other Ambulatory Visit: Payer: Self-pay

## 2022-02-20 ENCOUNTER — Other Ambulatory Visit: Payer: Medicaid Other | Admitting: Obstetrics and Gynecology

## 2022-02-20 NOTE — Patient Instructions (Signed)
Visit Information ° °Ms. Alexandria Osborn  - as a part of your Medicaid benefit, you are eligible for care management and care coordination services at no cost or copay. I was unable to reach you by phone today but would be happy to help you with your health related needs. Please feel free to call me at [phone] ° °A member of the Managed Medicaid care management team will reach out to you again over the next 7-14 days.  ° °Codylee Patil RN, BSN °[place]   [facility] °Care Management Coordinator - Managed Medicaid High Risk °[phone] °  °

## 2022-02-20 NOTE — Patient Outreach (Signed)
Care Coordination ? ?02/20/2022 ? ?Janifer Adie ?16-Oct-1971 ?[id number] ? ? ?Medicaid Managed Care  ? ?Unsuccessful Outreach Note ? ?02/20/2022 ?Name: Alexandria Osborn MRN: [id number] DOB: Mar 06, 1971 ? ?A second unsuccessful telephone outreach to offer case management/care coordination services was attempted today.  ? ?Plan: The care management team will reach out to the patient again over the next 7-14 business days.  ? ?Kathi Der RN, BSN ?[place]  [facility] ?Care Management Coordinator - Managed Medicaid High Risk ?[phone] ?  ? ?

## 2022-02-25 ENCOUNTER — Ambulatory Visit: Payer: Medicaid Other

## 2022-02-28 ENCOUNTER — Other Ambulatory Visit: Payer: Medicaid Other | Admitting: Obstetrics and Gynecology

## 2022-02-28 ENCOUNTER — Other Ambulatory Visit: Payer: Self-pay

## 2022-02-28 NOTE — Patient Instructions (Signed)
Visit Information ? ?Ms. Alexandria Osborn  - as a part of your Medicaid benefit, you are eligible for care management and care coordination services at no cost or copay. I was unable to reach you by phone today but would be happy to help you with your health related needs. Please feel free to call me at [phone] ? ?Kathi Der RN, BSN ?[place]  [facility] ?Care Management Coordinator - Managed Medicaid High Risk ?[phone] ?  ?

## 2022-02-28 NOTE — Patient Outreach (Signed)
Care Coordination ? ?02/28/2022 ? ?Janifer Adie ?08/31/1971 ?[id number] ? ? ?Medicaid Managed Care  ? ?Unsuccessful Outreach Note ? ?02/28/2022 ?Name: Alexandria Osborn MRN: [id number] DOB: 05-10-1971 ? ?A third unsuccessful telephone outreach to offer case management/care coordination services was attempted today. The care management team is pleased to engage with this patient at any time in the future should he/she be interested in assistance from the care management team.  ? ?Kathi Der RN, BSN ?  [facility] ?Care Management Coordinator - Managed Medicaid High Risk ?[phone] ?  ? ? ?
